# Patient Record
Sex: Female | Born: 1965 | Race: White | Hispanic: No | Marital: Single | Smoking: Current every day smoker
Health system: Southern US, Community
[De-identification: ages and names within clinical notes are randomized; demographics above are authoritative.]

## PROBLEM LIST (undated history)

## (undated) DIAGNOSIS — K219 Gastro-esophageal reflux disease without esophagitis: Secondary | ICD-10-CM

## (undated) DIAGNOSIS — R29898 Other symptoms and signs involving the musculoskeletal system: Secondary | ICD-10-CM

## (undated) DIAGNOSIS — F32A Depression, unspecified: Secondary | ICD-10-CM

## (undated) DIAGNOSIS — F329 Major depressive disorder, single episode, unspecified: Secondary | ICD-10-CM

## (undated) DIAGNOSIS — M4800 Spinal stenosis, site unspecified: Secondary | ICD-10-CM

## (undated) DIAGNOSIS — F4310 Post-traumatic stress disorder, unspecified: Secondary | ICD-10-CM

## (undated) DIAGNOSIS — F419 Anxiety disorder, unspecified: Secondary | ICD-10-CM

## (undated) DIAGNOSIS — Z889 Allergy status to unspecified drugs, medicaments and biological substances status: Secondary | ICD-10-CM

## (undated) DIAGNOSIS — F4541 Pain disorder exclusively related to psychological factors: Secondary | ICD-10-CM

## (undated) DIAGNOSIS — R7303 Prediabetes: Secondary | ICD-10-CM

## (undated) DIAGNOSIS — F40240 Claustrophobia: Secondary | ICD-10-CM

## (undated) HISTORY — PX: TUBAL LIGATION: SHX77

## (undated) HISTORY — PX: TONSILLECTOMY: SUR1361

## (undated) HISTORY — PX: DILATION AND CURETTAGE OF UTERUS: SHX78

---

## 2006-02-01 ENCOUNTER — Emergency Department: Payer: Self-pay | Admitting: Unknown Physician Specialty

## 2007-03-15 ENCOUNTER — Emergency Department: Payer: Self-pay | Admitting: Emergency Medicine

## 2007-04-17 ENCOUNTER — Emergency Department: Payer: Self-pay | Admitting: Emergency Medicine

## 2007-11-12 ENCOUNTER — Emergency Department: Payer: Self-pay | Admitting: Internal Medicine

## 2008-05-17 ENCOUNTER — Emergency Department: Payer: Self-pay

## 2008-09-26 ENCOUNTER — Emergency Department: Payer: Self-pay | Admitting: Emergency Medicine

## 2008-11-24 ENCOUNTER — Emergency Department: Payer: Self-pay | Admitting: Emergency Medicine

## 2009-02-13 ENCOUNTER — Emergency Department: Payer: Self-pay | Admitting: Emergency Medicine

## 2009-03-10 ENCOUNTER — Inpatient Hospital Stay: Payer: Self-pay | Admitting: Unknown Physician Specialty

## 2009-04-16 ENCOUNTER — Emergency Department: Payer: Self-pay | Admitting: Emergency Medicine

## 2009-08-22 ENCOUNTER — Emergency Department: Payer: Self-pay | Admitting: Emergency Medicine

## 2009-08-24 ENCOUNTER — Emergency Department: Payer: Self-pay | Admitting: Emergency Medicine

## 2010-02-12 ENCOUNTER — Emergency Department: Payer: Self-pay | Admitting: Internal Medicine

## 2010-03-30 ENCOUNTER — Emergency Department: Payer: Self-pay | Admitting: Emergency Medicine

## 2010-06-26 ENCOUNTER — Emergency Department: Payer: Self-pay | Admitting: Emergency Medicine

## 2010-09-14 ENCOUNTER — Emergency Department: Payer: Self-pay | Admitting: Emergency Medicine

## 2010-09-17 ENCOUNTER — Emergency Department: Payer: Self-pay | Admitting: Emergency Medicine

## 2010-09-27 ENCOUNTER — Emergency Department: Payer: Self-pay | Admitting: Internal Medicine

## 2011-03-07 ENCOUNTER — Emergency Department: Payer: Self-pay | Admitting: *Deleted

## 2011-11-19 ENCOUNTER — Emergency Department: Payer: Self-pay | Admitting: Emergency Medicine

## 2011-11-19 LAB — URINALYSIS, COMPLETE
Bacteria: NONE SEEN
Bilirubin,UR: NEGATIVE
Glucose,UR: NEGATIVE mg/dL (ref 0–75)
Ketone: NEGATIVE
Leukocyte Esterase: NEGATIVE
Ph: 6 (ref 4.5–8.0)
RBC,UR: 19 /HPF (ref 0–5)
Squamous Epithelial: 1

## 2011-11-19 LAB — WET PREP, GENITAL

## 2011-12-25 ENCOUNTER — Emergency Department: Payer: Self-pay | Admitting: Emergency Medicine

## 2012-03-27 ENCOUNTER — Emergency Department: Payer: Self-pay | Admitting: Emergency Medicine

## 2012-11-08 ENCOUNTER — Emergency Department: Payer: Self-pay | Admitting: Emergency Medicine

## 2013-02-25 ENCOUNTER — Emergency Department: Payer: Self-pay | Admitting: Internal Medicine

## 2013-12-13 ENCOUNTER — Emergency Department: Payer: Self-pay | Admitting: Emergency Medicine

## 2014-04-23 ENCOUNTER — Encounter: Payer: Self-pay | Admitting: Orthopedic Surgery

## 2014-05-03 ENCOUNTER — Encounter: Payer: Self-pay | Admitting: Orthopedic Surgery

## 2014-06-03 ENCOUNTER — Encounter: Payer: Self-pay | Admitting: Orthopedic Surgery

## 2015-02-04 ENCOUNTER — Other Ambulatory Visit: Payer: Self-pay | Admitting: Orthopedic Surgery

## 2015-02-06 ENCOUNTER — Encounter (HOSPITAL_BASED_OUTPATIENT_CLINIC_OR_DEPARTMENT_OTHER): Payer: Self-pay | Admitting: *Deleted

## 2015-02-13 ENCOUNTER — Ambulatory Visit (HOSPITAL_BASED_OUTPATIENT_CLINIC_OR_DEPARTMENT_OTHER): Payer: Worker's Compensation | Admitting: Anesthesiology

## 2015-02-13 ENCOUNTER — Encounter (HOSPITAL_BASED_OUTPATIENT_CLINIC_OR_DEPARTMENT_OTHER): Payer: Self-pay

## 2015-02-13 ENCOUNTER — Ambulatory Visit (HOSPITAL_BASED_OUTPATIENT_CLINIC_OR_DEPARTMENT_OTHER)
Admission: RE | Admit: 2015-02-13 | Discharge: 2015-02-13 | Disposition: A | Discharge status: Home / Self Care | Payer: Worker's Compensation | Source: Ambulatory Visit | Attending: Orthopedic Surgery | Admitting: Orthopedic Surgery

## 2015-02-13 ENCOUNTER — Encounter (HOSPITAL_BASED_OUTPATIENT_CLINIC_OR_DEPARTMENT_OTHER)
Admission: RE | Disposition: A | Discharge status: Home / Self Care | Payer: Self-pay | Source: Ambulatory Visit | Attending: Orthopedic Surgery

## 2015-02-13 DIAGNOSIS — F17210 Nicotine dependence, cigarettes, uncomplicated: Secondary | ICD-10-CM | POA: Insufficient documentation

## 2015-02-13 DIAGNOSIS — X58XXXA Exposure to other specified factors, initial encounter: Secondary | ICD-10-CM | POA: Insufficient documentation

## 2015-02-13 DIAGNOSIS — F4310 Post-traumatic stress disorder, unspecified: Secondary | ICD-10-CM | POA: Diagnosis not present

## 2015-02-13 DIAGNOSIS — Y990 Civilian activity done for income or pay: Secondary | ICD-10-CM | POA: Insufficient documentation

## 2015-02-13 DIAGNOSIS — Z79899 Other long term (current) drug therapy: Secondary | ICD-10-CM | POA: Insufficient documentation

## 2015-02-13 DIAGNOSIS — M67432 Ganglion, left wrist: Secondary | ICD-10-CM | POA: Diagnosis present

## 2015-02-13 DIAGNOSIS — F418 Other specified anxiety disorders: Secondary | ICD-10-CM | POA: Insufficient documentation

## 2015-02-13 DIAGNOSIS — S63512A Sprain of carpal joint of left wrist, initial encounter: Secondary | ICD-10-CM | POA: Diagnosis not present

## 2015-02-13 HISTORY — DX: Depression, unspecified: F32.A

## 2015-02-13 HISTORY — PX: GANGLION CYST EXCISION: SHX1691

## 2015-02-13 HISTORY — DX: Post-traumatic stress disorder, unspecified: F43.10

## 2015-02-13 HISTORY — DX: Anxiety disorder, unspecified: F41.9

## 2015-02-13 HISTORY — DX: Major depressive disorder, single episode, unspecified: F32.9

## 2015-02-13 HISTORY — PX: WRIST ARTHROSCOPY: SHX838

## 2015-02-13 SURGERY — ARTHROSCOPY, WRIST
Anesthesia: Regional | Site: Wrist | Laterality: Left

## 2015-02-13 MED ORDER — FENTANYL CITRATE (PF) 100 MCG/2ML IJ SOLN
50.0000 ug | INTRAMUSCULAR | Status: DC | PRN
  Administered 2015-02-13: 100 ug via INTRAVENOUS

## 2015-02-13 MED ORDER — OXYCODONE-ACETAMINOPHEN 5-325 MG PO TABS: ORAL_TABLET | ORAL | Status: DC

## 2015-02-13 MED ORDER — HYDROMORPHONE HCL 1 MG/ML IJ SOLN: 0.2500 mg | INTRAMUSCULAR | Status: DC | PRN

## 2015-02-13 MED ORDER — VANCOMYCIN HCL 1000 MG IV SOLR
1000.0000 mg | INTRAVENOUS | Status: DC | PRN
  Administered 2015-02-13: 1000 mg via INTRAVENOUS

## 2015-02-13 MED ORDER — DEXAMETHASONE SODIUM PHOSPHATE 10 MG/ML IJ SOLN
INTRAMUSCULAR | Status: DC | PRN
  Administered 2015-02-13: 10 mg via INTRAVENOUS

## 2015-02-13 MED ORDER — OXYCODONE HCL 5 MG PO TABS: 5.0000 mg | ORAL_TABLET | Freq: Once | ORAL | Status: DC | PRN

## 2015-02-13 MED ORDER — ONDANSETRON HCL 4 MG/2ML IJ SOLN
INTRAMUSCULAR | Status: DC | PRN
  Administered 2015-02-13: 4 mg via INTRAVENOUS

## 2015-02-13 MED ORDER — ONDANSETRON HCL 4 MG/2ML IJ SOLN: 4.0000 mg | Freq: Once | INTRAMUSCULAR | Status: DC | PRN

## 2015-02-13 MED ORDER — LIDOCAINE HCL (CARDIAC) 20 MG/ML IV SOLN
INTRAVENOUS | Status: AC
  Filled 2015-02-13: qty 5

## 2015-02-13 MED ORDER — CEFAZOLIN SODIUM-DEXTROSE 2-3 GM-% IV SOLR: 2.0000 g | INTRAVENOUS | Status: DC

## 2015-02-13 MED ORDER — ONDANSETRON HCL 4 MG/2ML IJ SOLN
INTRAMUSCULAR | Status: AC
  Filled 2015-02-13: qty 2

## 2015-02-13 MED ORDER — CHLORHEXIDINE GLUCONATE 4 % EX LIQD: 60.0000 mL | Freq: Once | CUTANEOUS | Status: DC

## 2015-02-13 MED ORDER — MIDAZOLAM HCL 2 MG/2ML IJ SOLN
1.0000 mg | INTRAMUSCULAR | Status: DC | PRN
  Administered 2015-02-13: 2 mg via INTRAVENOUS

## 2015-02-13 MED ORDER — FENTANYL CITRATE (PF) 100 MCG/2ML IJ SOLN
INTRAMUSCULAR | Status: AC
  Filled 2015-02-13: qty 2

## 2015-02-13 MED ORDER — OXYCODONE HCL 5 MG/5ML PO SOLN: 5.0000 mg | Freq: Once | ORAL | Status: DC | PRN

## 2015-02-13 MED ORDER — PROPOFOL 10 MG/ML IV BOLUS
INTRAVENOUS | Status: DC | PRN
  Administered 2015-02-13: 150 mg via INTRAVENOUS

## 2015-02-13 MED ORDER — GLYCOPYRROLATE 0.2 MG/ML IJ SOLN: 0.2000 mg | Freq: Once | INTRAMUSCULAR | Status: DC | PRN

## 2015-02-13 MED ORDER — VANCOMYCIN HCL IN DEXTROSE 1-5 GM/200ML-% IV SOLN
INTRAVENOUS | Status: AC
  Filled 2015-02-13: qty 200

## 2015-02-13 MED ORDER — MIDAZOLAM HCL 2 MG/2ML IJ SOLN
INTRAMUSCULAR | Status: AC
  Filled 2015-02-13: qty 2

## 2015-02-13 MED ORDER — LACTATED RINGERS IV SOLN
INTRAVENOUS | Status: DC
  Administered 2015-02-13 (×2): via INTRAVENOUS

## 2015-02-13 MED ORDER — SODIUM CHLORIDE 0.9 % IR SOLN
Status: DC | PRN
  Administered 2015-02-13: 1500 mL

## 2015-02-13 MED ORDER — CEFAZOLIN SODIUM-DEXTROSE 2-3 GM-% IV SOLR
INTRAVENOUS | Status: AC
Start: 2015-02-13 — End: 2015-02-13
  Filled 2015-02-13: qty 50

## 2015-02-13 MED ORDER — LIDOCAINE HCL (CARDIAC) 20 MG/ML IV SOLN
INTRAVENOUS | Status: DC | PRN
  Administered 2015-02-13: 60 mg via INTRAVENOUS

## 2015-02-13 MED ORDER — SCOPOLAMINE 1 MG/3DAYS TD PT72: 1.0000 | MEDICATED_PATCH | Freq: Once | TRANSDERMAL | Status: DC | PRN

## 2015-02-13 MED ORDER — DEXAMETHASONE SODIUM PHOSPHATE 10 MG/ML IJ SOLN
INTRAMUSCULAR | Status: AC
  Filled 2015-02-13: qty 1

## 2015-02-13 SURGICAL SUPPLY — 81 items
BANDAGE ELASTIC 3 VELCRO ST LF (GAUZE/BANDAGES/DRESSINGS) ×3 IMPLANT
BANDAGE ELASTIC 4 VELCRO ST LF (GAUZE/BANDAGES/DRESSINGS) IMPLANT
BLADE CUDA 2.0 (BLADE) IMPLANT
BLADE EAR TYMPAN 2.5 60D BEAV (BLADE) IMPLANT
BLADE MINI RND TIP GREEN BEAV (BLADE) IMPLANT
BLADE SURG 15 STRL LF DISP TIS (BLADE) ×2 IMPLANT
BLADE SURG 15 STRL SS (BLADE) ×4
BNDG ESMARK 4X9 LF (GAUZE/BANDAGES/DRESSINGS) ×3 IMPLANT
BNDG GAUZE ELAST 4 BULKY (GAUZE/BANDAGES/DRESSINGS) IMPLANT
BUR CUDA 2.9 (BURR) IMPLANT
BUR CUDA 2.9MM (BURR)
BUR FULL RADIUS 2.0 (BURR) IMPLANT
BUR FULL RADIUS 2.0MM (BURR)
BUR FULL RADIUS 2.9 (BURR) IMPLANT
BUR FULL RADIUS 2.9MM (BURR)
BUR GATOR 2.9 (BURR) IMPLANT
BUR GATOR 2.9MM (BURR)
BUR SPHERICAL 2.9 (BURR) IMPLANT
BUR SPHERICAL 2.9MM (BURR)
CANISTER SUCT 1200ML W/VALVE (MISCELLANEOUS) ×6 IMPLANT
CHLORAPREP W/TINT 26ML (MISCELLANEOUS) ×3 IMPLANT
CLOSURE WOUND 1/2 X4 (GAUZE/BANDAGES/DRESSINGS)
CORDS BIPOLAR (ELECTRODE) ×3 IMPLANT
COVER BACK TABLE 60X90IN (DRAPES) ×3 IMPLANT
CUFF TOURNIQUET SINGLE 18IN (TOURNIQUET CUFF) ×3 IMPLANT
DRAPE EXTREMITY T 121X128X90 (DRAPE) ×3 IMPLANT
DRAPE OEC MINIVIEW 54X84 (DRAPES) IMPLANT
DRAPE SURG 17X23 STRL (DRAPES) ×3 IMPLANT
DRAPE U 20/CS (DRAPES) ×3 IMPLANT
ELECT SMALL JOINT 90D BASC (ELECTRODE) IMPLANT
GAUZE SPONGE 4X4 12PLY STRL (GAUZE/BANDAGES/DRESSINGS) ×3 IMPLANT
GAUZE XEROFORM 1X8 LF (GAUZE/BANDAGES/DRESSINGS) ×3 IMPLANT
GLOVE BIO SURGEON STRL SZ7.5 (GLOVE) ×3 IMPLANT
GLOVE BIOGEL PI IND STRL 7.0 (GLOVE) ×2 IMPLANT
GLOVE BIOGEL PI IND STRL 8.5 (GLOVE) IMPLANT
GLOVE BIOGEL PI INDICATOR 7.0 (GLOVE) ×4
GLOVE BIOGEL PI INDICATOR 8.5 (GLOVE)
GLOVE ECLIPSE 6.5 STRL STRAW (GLOVE) ×6 IMPLANT
GLOVE SURG ORTHO 8.0 STRL STRW (GLOVE) IMPLANT
GOWN STRL REUS W/ TWL LRG LVL3 (GOWN DISPOSABLE) ×2 IMPLANT
GOWN STRL REUS W/TWL LRG LVL3 (GOWN DISPOSABLE) ×4
GOWN STRL REUS W/TWL XL LVL3 (GOWN DISPOSABLE) ×6 IMPLANT
IV SET EXTENSION GRAVITY 40 LF (IV SETS) ×3 IMPLANT
MANIFOLD NEPTUNE II (INSTRUMENTS) IMPLANT
NDL SAFETY ECLIPSE 18X1.5 (NEEDLE) ×2 IMPLANT
NEEDLE HYPO 18GX1.5 SHARP (NEEDLE) ×4
NEEDLE HYPO 22GX1.5 SAFETY (NEEDLE) ×3 IMPLANT
NEEDLE SPNL 18GX3.5 QUINCKE PK (NEEDLE) IMPLANT
NEEDLE TUOHY 20GX3.5 (NEEDLE) IMPLANT
PACK BASIN DAY SURGERY FS (CUSTOM PROCEDURE TRAY) ×3 IMPLANT
PAD CAST 3X4 CTTN HI CHSV (CAST SUPPLIES) ×1 IMPLANT
PADDING CAST ABS 3INX4YD NS (CAST SUPPLIES) ×2
PADDING CAST ABS 4INX4YD NS (CAST SUPPLIES) ×2
PADDING CAST ABS COTTON 3X4 (CAST SUPPLIES) ×1 IMPLANT
PADDING CAST ABS COTTON 4X4 ST (CAST SUPPLIES) ×1 IMPLANT
PADDING CAST COTTON 3X4 STRL (CAST SUPPLIES) ×2
ROUTER HOODED VORTEX 2.9MM (BLADE) IMPLANT
SET ARTHROSCOPY TUBING (MISCELLANEOUS) ×2
SET ARTHROSCOPY TUBING LN (MISCELLANEOUS) ×1 IMPLANT
SET SM JOINT TUBING/CANN (CANNULA) IMPLANT
SLEEVE SCD COMPRESS KNEE MED (MISCELLANEOUS) ×3 IMPLANT
SPLINT PLASTER CAST XFAST 3X15 (CAST SUPPLIES) ×20 IMPLANT
SPLINT PLASTER XTRA FASTSET 3X (CAST SUPPLIES) ×40
STOCKINETTE 4X48 STRL (DRAPES) ×3 IMPLANT
STRIP CLOSURE SKIN 1/2X4 (GAUZE/BANDAGES/DRESSINGS) IMPLANT
SUCTION FRAZIER TIP 10 FR DISP (SUCTIONS) IMPLANT
SUT ETHILON 4 0 PS 2 18 (SUTURE) ×3 IMPLANT
SUT ETHILON 5 0 P 3 18 (SUTURE)
SUT MERSILENE 4 0 P 3 (SUTURE) IMPLANT
SUT NYLON ETHILON 5-0 P-3 1X18 (SUTURE) IMPLANT
SUT PDS AB 2-0 CT2 27 (SUTURE) IMPLANT
SUT STEEL 4 0 (SUTURE) IMPLANT
SUT VIC AB 2-0 PS2 27 (SUTURE) IMPLANT
SUT VICRYL 4-0 PS2 18IN ABS (SUTURE) IMPLANT
SYR BULB 3OZ (MISCELLANEOUS) ×3 IMPLANT
SYR CONTROL 10ML LL (SYRINGE) ×3 IMPLANT
TUBE CONNECTING 20'X1/4 (TUBING) ×1
TUBE CONNECTING 20X1/4 (TUBING) ×2 IMPLANT
UNDERPAD 30X30 (UNDERPADS AND DIAPERS) ×3 IMPLANT
WAND 1.5 MICROBLATOR (SURGICAL WAND) ×3 IMPLANT
WATER STERILE IRR 1000ML POUR (IV SOLUTION) ×3 IMPLANT

## 2015-02-13 NOTE — H&P (Signed)
  Sandra BachelorLinda K Cantrell is an 49 y.o. female.   Chief Complaint: left wrist pain and ganglion HPI: 49 yo rhd female states she injured left wrist at work 02/07/15 when a box broke causing a rotational injury to left wrist.  Has had continued pain.  MRI with possible SL and LT injury.  Also volar ganglion cyst noted.  She wishes to have left wrist arthroscopy and ganglion excision.    Past Medical History  Diagnosis Date  . Post traumatic stress disorder (PTSD)   . Anxiety   . Depression     Past Surgical History  Procedure Laterality Date  . Tonsillectomy    . Dilation and curettage of uterus    . Tubal ligation      History reviewed. No pertinent family history. Social History:  reports that she has been smoking Cigarettes.  She has been smoking about 0.50 packs per day. She has never used smokeless tobacco. She reports that she does not drink alcohol or use illicit drugs.  Allergies:  Allergies  Allergen Reactions  . Penicillins Hives    Medications Prior to Admission  Medication Sig Dispense Refill  . buPROPion (WELLBUTRIN XL) 150 MG 24 hr tablet Take 150 mg by mouth daily.      No results found for this or any previous visit (from the past 48 hour(s)).  No results found.   A comprehensive review of systems was negative except for: Eyes: positive for contacts/glasses Ears, nose, mouth, throat, and face: positive for tinnitus Neurological: positive for headaches Behavioral/Psych: positive for anxiety, depression, sleep disturbance and PTSD  Blood pressure 120/52, pulse 78, temperature 98.3 F (36.8 C), temperature source Oral, resp. rate 20, height 5\' 6"  (1.676 m), weight 87.091 kg (192 lb), last menstrual period 02/05/2015, SpO2 99 %.  General appearance: alert, cooperative and appears stated age Head: Normocephalic, without obvious abnormality, atraumatic Neck: supple, symmetrical, trachea midline Resp: clear to auscultation bilaterally Cardio: regular rate and  rhythm GI: non tender Extremities: intact sensation and capillary refill all digits.  +epl/fpl/io.  no wounds.   Pulses: 2+ and symmetric Skin: Skin color, texture, turgor normal. No rashes or lesions Neurologic: Grossly normal Incision/Wound: none  Assessment/Plan Left wrist SL/LT injury and volar ganglion cyst.  Plan left wrist arthroscopy with possible debridement vs shrinkage and excision of volar ganglion cyst.  Non operative and operative treatment options were discussed with the patient and patient wishes to proceed with operative treatment. Risks, benefits, and alternatives of surgery were discussed and the patient agrees with the plan of care.   Jameica Couts R 02/13/2015, 12:58 PM

## 2015-02-13 NOTE — Transfer of Care (Signed)
Immediate Anesthesia Transfer of Care Note  Patient: Sandra Cantrell  Procedure(s) Performed: Procedure(s): LEFT ARTHROSCOPY WRIST VOLAR GANGLION EXCISION (Left) REMOVAL GANGLION OF WRIST (Left)  Patient Location: PACU  Anesthesia Type:GA combined with regional for post-op pain  Level of Consciousness: awake and patient cooperative  Airway & Oxygen Therapy: Patient Spontanous Breathing and Patient connected to face mask oxygen  Post-op Assessment: Report given to RN, Post -op Vital signs reviewed and stable and Patient moving all extremities  Post vital signs: Reviewed and stable  Last Vitals:  Filed Vitals:   02/13/15 1138  BP: 120/52  Pulse: 78  Temp: 36.8 C  Resp: 20    Complications: No apparent anesthesia complications

## 2015-02-13 NOTE — Progress Notes (Signed)
Assisted Dr. Joslin with left, ultrasound guided, interscalene  block. Side rails up, monitors on throughout procedure. See vital signs in flow sheet. Tolerated Procedure well. 

## 2015-02-13 NOTE — Anesthesia Procedure Notes (Addendum)
Anesthesia Regional Block:  Supraclavicular block  Pre-Anesthetic Checklist: ,, timeout performed, Correct Patient, Correct Site, Correct Laterality, Correct Procedure, Correct Position, site marked, Risks and benefits discussed,  Surgical consent,  Pre-op evaluation,  At surgeon's request and post-op pain management  Laterality: Left  Prep: chloraprep       Needles:  Injection technique: Single-shot  Needle Type: Echogenic Stimulator Needle     Needle Length: 9cm 9 cm Needle Gauge: 21 and 21 G    Additional Needles:  Procedures: ultrasound guided (picture in chart) Supraclavicular block Narrative:  Start time: 02/13/2015 1:10 PM End time: 02/13/2015 1:15 PM Injection made incrementally with aspirations every 5 mL.  Performed by: Personally   Additional Notes: 30 cc 0.5% Bupivacaine with 1:200 Epi injected easily   Procedure Name: LMA Insertion Date/Time: 02/13/2015 1:52 PM Performed by: Curly ShoresRAFT, Melady Chow W Pre-anesthesia Checklist: Patient identified, Emergency Drugs available, Suction available and Patient being monitored Patient Re-evaluated:Patient Re-evaluated prior to inductionOxygen Delivery Method: Circle System Utilized Preoxygenation: Pre-oxygenation with 100% oxygen Intubation Type: IV induction Ventilation: Mask ventilation without difficulty LMA: LMA inserted LMA Size: 4.0 Number of attempts: 1 Airway Equipment and Method: Bite block (patient states jaw sometimes locks, small bite block used.) Placement Confirmation: positive ETCO2 and breath sounds checked- equal and bilateral Tube secured with: Tape Dental Injury: Teeth and Oropharynx as per pre-operative assessment

## 2015-02-13 NOTE — Anesthesia Preprocedure Evaluation (Addendum)
Anesthesia Evaluation  Patient identified by MRN, date of birth, ID band Patient awake    Reviewed: Allergy & Precautions, NPO status , Patient's Chart, lab work & pertinent test results  Airway Mallampati: II  TM Distance: >3 FB Neck ROM: Full    Dental  (+) Teeth Intact, Dental Advisory Given   Pulmonary Current Smoker,    breath sounds clear to auscultation       Cardiovascular negative cardio ROS   Rhythm:Regular Rate:Normal     Neuro/Psych PSYCHIATRIC DISORDERS Anxiety Depression    GI/Hepatic negative GI ROS, Neg liver ROS,   Endo/Other  negative endocrine ROS  Renal/GU negative Renal ROS     Musculoskeletal negative musculoskeletal ROS (+)   Abdominal   Peds negative pediatric ROS (+)  Hematology negative hematology ROS (+)   Anesthesia Other Findings   Reproductive/Obstetrics negative OB ROS                            Anesthesia Physical Anesthesia Plan  ASA: II  Anesthesia Plan: General   Post-op Pain Management: GA combined w/ Regional for post-op pain   Induction: Intravenous  Airway Management Planned: LMA  Additional Equipment:   Intra-op Plan:   Post-operative Plan:   Informed Consent: I have reviewed the patients History and Physical, chart, labs and discussed the procedure including the risks, benefits and alternatives for the proposed anesthesia with the patient or authorized representative who has indicated his/her understanding and acceptance.   Dental advisory given  Plan Discussed with: CRNA and Anesthesiologist  Anesthesia Plan Comments: (scapholunate tear L . Wrist  Smoker Anxiety  Plan GA with LMA and  Supraclavicular block  Kipp Broodavid Joslin         )        Anesthesia Quick Evaluation

## 2015-02-13 NOTE — Anesthesia Postprocedure Evaluation (Signed)
  Anesthesia Post-op Note  Patient: Sandra BachelorLinda K Darnell  Procedure(s) Performed: Procedure(s): LEFT ARTHROSCOPY WRIST VOLAR GANGLION EXCISION (Left) REMOVAL GANGLION OF WRIST (Left)  Patient Location: PACU  Anesthesia Type:GA combined with regional for post-op pain  Level of Consciousness: awake, alert  and oriented  Airway and Oxygen Therapy: Patient Spontanous Breathing  Post-op Pain: none  Post-op Assessment: Post-op Vital signs reviewed and Patient's Cardiovascular Status Stable              Post-op Vital Signs: Reviewed and stable  Last Vitals:  Filed Vitals:   02/13/15 1600  BP: 116/72  Pulse: 74  Temp:   Resp: 16    Complications: No apparent anesthesia complications

## 2015-02-13 NOTE — Op Note (Signed)
[id number] 

## 2015-02-13 NOTE — Brief Op Note (Signed)
02/13/2015  3:32 PM  PATIENT:  Keenan BachelorLinda K Woznick  49 y.o. female  PRE-OPERATIVE DIAGNOSIS:  LEFT SCAPHLUNATE TEAR VOLAR GANGLION CYST  POST-OPERATIVE DIAGNOSIS:  LEFT SCAPHLUNATE TEAR VOLAR GANGLION CYST  PROCEDURE:  Procedure(s): LEFT ARTHROSCOPY WRIST VOLAR GANGLION EXCISION (Left) REMOVAL GANGLION OF WRIST (Left)  SURGEON:  Surgeon(s) and Role:    * Betha LoaKevin Bari Handshoe, MD - Primary    * Cindee SaltGary Harlan Ervine, MD - Assisting  PHYSICIAN ASSISTANT:   ASSISTANTS: Cindee SaltGary Mazie Fencl, MD   ANESTHESIA:   regional and general  EBL:  Total I/O In: 1200 [I.V.:1200] Out: -   BLOOD ADMINISTERED:none  DRAINS: none   LOCAL MEDICATIONS USED:  NONE  SPECIMEN:  Source of Specimen:  left wrist  DISPOSITION OF SPECIMEN:  PATHOLOGY  COUNTS:  YES  TOURNIQUET:   Total Tourniquet Time Documented: Upper Arm (Left) - 26 minutes Total: Upper Arm (Left) - 26 minutes   DICTATION: .Other Dictation: Dictation Number [id number]  PLAN OF CARE: Discharge to home after PACU  PATIENT DISPOSITION:  PACU - hemodynamically stable.

## 2015-02-13 NOTE — Discharge Instructions (Addendum)

## 2015-02-14 ENCOUNTER — Encounter (HOSPITAL_BASED_OUTPATIENT_CLINIC_OR_DEPARTMENT_OTHER): Payer: Self-pay | Admitting: Orthopedic Surgery

## 2015-02-14 NOTE — Op Note (Signed)
NAMEJANELL, KEELING NO.:  [id number]  MEDICAL RECORD NO.:  [id number]  LOCATION:                                 FACILITY:  PHYSICIAN:  Betha Loa, MD             DATE OF BIRTH:  DATE OF PROCEDURE:  02/13/2015 DATE OF DISCHARGE:                              OPERATIVE REPORT   PREOPERATIVE DIAGNOSIS:  Left wrist volar ganglion cyst and scapholunate ligament, possible lunotriquetral ligament injuries.  POSTOPERATIVE DIAGNOSIS:  Left wrist volar ganglion cyst and scapholunate ligament tear.  PROCEDURE:   1. Left wrist arthroscopy with scapholunate thermal shrinkage 2. Left wrist volar ganglion excision via separate incision.  SURGEON:  Betha Loa, MD  ASSISTANT:  Cindee Salt, M.D.  ANESTHESIA:  General with regional.  IV FLUIDS:  Per anesthesia flow sheet.  ESTIMATED BLOOD LOSS:  Minimal.  COMPLICATIONS:  None.  SPECIMENS:  Left wrist ganglion to Pathology.  TOURNIQUET TIME:  26 minutes.  DISPOSITION:  Stable to PACU.  INDICATIONS:  Ms. Washburn is a 49 year old female who states approximately 1 year ago she injured her left wrist when a box she was carrying broke and twisted her wrist.  She underwent other medical care, all nonoperative, and was referred to me approximately 1 month ago.  MRI showed volar ganglion cyst and possible SL and LT ligament injuries.  We discussed treatment options including wrist arthroscopy with ganglion excision and possible capsular shrinkage versus debridement.  Risks, benefits, and alternatives of surgery were discussed including risk of blood loss; infection; damage to nerves, vessels, tendons, ligaments, bone; failure of surgery; need for additional surgery; complications with wound healing, continued pain, recurrence of ganglion.  She voiced understanding of these risks and elected to proceed.  OPERATIVE COURSE:  After being identified preoperatively by myself, the patient and I agreed upon procedure and  site of procedure.  Surgical site was marked.  The risks, benefits, and alternatives of surgery were reviewed and she wished to proceed.  Surgical consent had been signed. She was given IV vancomycin as preoperative antibiotic prophylaxis due to penicillin allergy.  A regional block was performed by Anesthesia in the preop holding room.  She was transported to the operating room and placed on the operating room table in a supine position with the left upper extremity on arm board.  General anesthesia was induced by anesthesiologist.  The left upper extremity was prepped and draped in normal sterile orthopedic fashion.  Surgical pause was performed between surgeons, anesthesia, and operating room staff and all were in agreement as to the patient, procedure, and site of procedure.  The left arm was secured in the arthroscopy tower.  Incision was made at the 3/4 portal after insufflation of the joint.  This was carried into subcutaneous tissues by spreading.  The capsule was entered with a trocar.  Camera was introduced.  The wrist joint was visualized.  The scapholunate ligament had tear.  I was not able to place the camera or later the trocar in between the scaphoid and lunate, but the probe would pass in between.  The ligament was torn dorsally and stretched and patulous proximally and centrally.  The LT ligament was visualized and was intact.  The TFCC was rough and abraded, but it was intact.  There was no peripheral tear noted.  The prestyloid recess was noted.  A 4/5 portal was made for introduction of arthroscopic instruments including the probe and shaver.  The 3/4 and 4/5 portals were used sequentially for debridement of the joint.  There was significant fraying of cartilage and capsule as well as some chondral surface, which was debrided with a shaver.  The ArthroWand was introduced and the patulous portion of the scapholunate ligament shrunk.  The camera was then introduced  through a separate portal into the midcarpal joint, which was examined.  The scapholunate was not widened, but with placement of the K- wire over the lunate, there would be no step-off.  The LT interval was not widened.  The capitohamate articulation was visible.  There was some tearing of the intercarpal ligament tear.  The STT joint was visualized and appeared intact.  The arthroscopic equipments were removed and the arm taken down from the arthroscopy tower.  An Esmarch bandage was used to exsanguinate the limb and the tourniquet at the proximal aspect of the extremity was inflated to 250 mmHg.  An incision was made at the volar radial aspect of the wrist and carried into the subcutaneous tissues by spreading technique.  Bipolar electrocautery was used to obtain hemostasis.  The FCR tendon sheath was incised both superficially and deep.  The FCR and FPL were swept ulnarly to reach the ganglion. The ganglion was identified and removed.  It was sent to Pathology for examination.  The stalk toward the radiocarpal joint was identified.  It was lightly debrided with rongeurs and treated with bipolar.  4-0 Vicryl suture was placed in the rent in the capsule.  The wound was copiously irrigated with sterile saline.  Two inverted interrupted Vicryl sutures were placed in the subcutaneous tissues and skin was closed with 4-0 nylon in a horizontal mattress fashion.  The dorsal wounds were closed with the nylon suture as well.  The wounds were all dressed with sterile Xeroform, 4x4s, and wrapped with a Kerlix bandage.  Thumb spica splint was placed and wrapped with Kerlix and Ace bandage.  Tourniquet was deflated at 26 minutes.  Fingertips were pink with brisk capillary refill after deflation of tourniquet.  The operative drapes were broken down and the patient was awoken from anesthesia safely.  She was transferred back to the stretcher and taken to PACU in stable condition. I will see her back in  the office in 1 week for postoperative followup. I will give her Percocet 5/325 one to two p.o. q.6 hours p.r.n. pain, dispensed #30.     Betha LoaKevin Tyauna Lacaze, MD     KK/MEDQ  D:  02/13/2015  T:  02/14/2015  Job:  [id number]

## 2015-03-18 ENCOUNTER — Emergency Department
Admission: EM | Admit: 2015-03-18 | Discharge: 2015-03-18 | Disposition: A | Discharge status: Home / Self Care | Payer: Self-pay | Attending: Emergency Medicine | Admitting: Emergency Medicine

## 2015-03-18 DIAGNOSIS — Z880 Allergy status to penicillin: Secondary | ICD-10-CM | POA: Insufficient documentation

## 2015-03-18 DIAGNOSIS — Z79891 Long term (current) use of opiate analgesic: Secondary | ICD-10-CM | POA: Insufficient documentation

## 2015-03-18 DIAGNOSIS — F17210 Nicotine dependence, cigarettes, uncomplicated: Secondary | ICD-10-CM | POA: Insufficient documentation

## 2015-03-18 DIAGNOSIS — Z79899 Other long term (current) drug therapy: Secondary | ICD-10-CM | POA: Insufficient documentation

## 2015-03-18 DIAGNOSIS — N764 Abscess of vulva: Secondary | ICD-10-CM | POA: Insufficient documentation

## 2015-03-18 DIAGNOSIS — N760 Acute vaginitis: Secondary | ICD-10-CM

## 2015-03-18 MED ORDER — SULFAMETHOXAZOLE-TRIMETHOPRIM 800-160 MG PO TABS: 1.0000 | ORAL_TABLET | Freq: Two times a day (BID) | ORAL | Status: DC

## 2015-03-18 MED ORDER — HYDROCODONE-ACETAMINOPHEN 5-325 MG PO TABS: 1.0000 | ORAL_TABLET | ORAL | Status: DC | PRN

## 2015-03-18 NOTE — ED Notes (Signed)
Pt c/o left labia swelling and pain.. Denies any drainage

## 2015-03-18 NOTE — ED Notes (Signed)
States she noticed a swollen area to side of left labia 2 days ago ..possible abscess

## 2015-03-18 NOTE — ED Provider Notes (Signed)
[facility] Emergency Department Provider Note  ____________________________________________  Time seen: Approximately 3:01 PM  I have reviewed the triage vital signs and the nursing notes.   HISTORY  Chief Complaint Abscess    HPI Sandra Cantrell is a 49 y.o. female who presents to the emergency department complaining of left-sided labial swelling and pain. She states that this is secondary of symptoms. She states area initially was a "burning sensation". She states that over the intervening 48 hours she has noticed experiencing and now there is a "knot" on the left labia. She denies any fevers or chills, abdominal pain, nausea or vomiting, dysuria or polyuria. Patient is tried over-the-counter Tylenol with no relief. Patient states the pain is sharp, constant, and worse with palpation or sitting.   Past Medical History  Diagnosis Date  . Post traumatic stress disorder (PTSD)   . Anxiety   . Depression     There are no active problems to display for this patient.   Past Surgical History  Procedure Laterality Date  . Tonsillectomy    . Dilation and curettage of uterus    . Tubal ligation    . Wrist arthroscopy Left 02/13/2015    Procedure: LEFT ARTHROSCOPY WRIST VOLAR GANGLION EXCISION;  Surgeon: Betha Loa, MD;  Location: [facility];  Service: Orthopedics;  Laterality: Left;  . Ganglion cyst excision Left 02/13/2015    Procedure: REMOVAL GANGLION OF WRIST;  Surgeon: Betha Loa, MD;  Location: [facility];  Service: Orthopedics;  Laterality: Left;    Current Outpatient Rx  Name  Route  Sig  Dispense  Refill  . buPROPion (WELLBUTRIN XL) 150 MG 24 hr tablet   Oral   Take 150 mg by mouth daily.         Marland Kitchen HYDROcodone-acetaminophen (NORCO/VICODIN) 5-325 MG tablet   Oral   Take 1 tablet by mouth every 4 (four) hours as needed for moderate pain.   15 tablet   0   . oxyCODONE-acetaminophen (PERCOCET) 5-325 MG  tablet      1-2 tabs po q6 hours prn pain   30 tablet   0   . sulfamethoxazole-trimethoprim (BACTRIM DS,SEPTRA DS) 800-160 MG tablet   Oral   Take 1 tablet by mouth 2 (two) times daily.   14 tablet   0     Allergies Penicillins  No family history on file.  Social History Social History  Substance Use Topics  . Smoking status: Current Every Day Smoker -- 0.50 packs/day    Types: Cigarettes  . Smokeless tobacco: Never Used  . Alcohol Use: No    Review of Systems Constitutional: No fever/chills Eyes: No visual changes. ENT: No sore throat. Cardiovascular: Denies chest pain. Respiratory: Denies shortness of breath. Gastrointestinal: No abdominal pain.  No nausea, no vomiting.  No diarrhea.  No constipation. Genitourinary: Negative for dysuria. Musculoskeletal: Negative for back pain. Skin: Negative for rash. Endorses swelling/knot left labia. Neurological: Negative for headaches, focal weakness or numbness.  10-point ROS otherwise negative.  ____________________________________________   PHYSICAL EXAM:  VITAL SIGNS: ED Triage Vitals  Enc Vitals Group     BP 03/18/15 1450 129/61 mmHg     Pulse Rate 03/18/15 1450 89     Resp 03/18/15 1450 16     Temp 03/18/15 1450 98.9 F (37.2 C)     Temp Source 03/18/15 1450 Oral     SpO2 03/18/15 1450 99 %     Weight 03/18/15 1450 190 lb (86.183  kg)     Height 03/18/15 1450 5\' 5"  (1.651 m)     Head Cir --      Peak Flow --      Pain Score 03/18/15 1451 7     Pain Loc --      Pain Edu? --      Excl. in GC? --     Constitutional: Alert and oriented. Well appearing and in no acute distress. Eyes: Conjunctivae are normal. PERRL. EOMI. Head: Atraumatic. Nose: No congestion/rhinnorhea. Mouth/Throat: Mucous membranes are moist.  Oropharynx non-erythematous. Neck: No stridor.   Hematological/Lymphatic/Immunilogical: No cervical lymphadenopathy. Cardiovascular: Normal rate, regular rhythm. Grossly normal heart sounds.   Good peripheral circulation. Respiratory: Normal respiratory effort.  No retractions. Lungs CTAB. Gastrointestinal: Soft and nontender. No distention. No abdominal bruits. No CVA tenderness. Genitourinary: External genitalia exam reveals erythematous, edematous lesion to left labia. Area is firm to palpation. No fluctuance noted. No drainage noted. No other abnormality noted to the external genitalia. Pelvic exam was not performed. Musculoskeletal: No lower extremity tenderness nor edema.  No joint effusions. Neurologic:  Normal speech and language. No gross focal neurologic deficits are appreciated. No gait instability. Skin:  Skin is warm, dry and intact. No rash noted. Psychiatric: Mood and affect are normal. Speech and behavior are normal.  ____________________________________________   LABS (all labs ordered are listed, but only abnormal results are displayed)  Labs Reviewed - No data to display ____________________________________________  EKG   ____________________________________________  RADIOLOGY   ____________________________________________   PROCEDURES  Procedure(s) performed: None  Critical Care performed: No  ____________________________________________   INITIAL IMPRESSION / ASSESSMENT AND PLAN / ED COURSE  Pertinent labs & imaging results that were available during my care of the patient were reviewed by me and considered in my medical decision making (see chart for details).  The patient's history, symptoms, physical exam are taken and consideration for diagnosis. Area on left labia was examined and revealed erythematous, edematous, firm to the touch lesion. This is consistent with bacterial skin infection. There is no indication for I&D at this time. Strict instructions to the patient given to monitor area for any increasing fluctuance or symptoms. Patient verbalizes understanding. The patient released sent home with narcotic pain medication as well as  antibiotics for symptom control. Patient verbalizes understanding of the diagnosis and treatment plan and verbalizes compliance with same. ____________________________________________   FINAL CLINICAL IMPRESSION(S) / ED DIAGNOSES  Final diagnoses:  Labial infection      Racheal PatchesJonathan D Shakena Callari, PA-C 03/18/15 1519  Phineas SemenGraydon Goodman, MD 03/18/15 [id number]

## 2015-03-18 NOTE — Discharge Instructions (Signed)
Cellulitis    Cellulitis is an infection of the skin and the tissue beneath it. The infected area is usually red and tender. Cellulitis occurs most often in the arms and lower legs.  CAUSES  Cellulitis is caused by bacteria that enter the skin through cracks or cuts in the skin. The most common types of bacteria that cause cellulitis are staphylococci and streptococci.  SIGNS AND SYMPTOMS  Redness and warmth.  Swelling.  Tenderness or pain.  Fever. DIAGNOSIS  Your health care provider can usually determine what is wrong based on a physical exam. Blood tests may also be done.  TREATMENT  Treatment usually involves taking an antibiotic medicine.  HOME CARE INSTRUCTIONS  Take your antibiotic medicine as directed by your health care provider. Finish the antibiotic even if you start to feel better.  Keep the infected arm or leg elevated to reduce swelling.  Apply a warm cloth to the affected area up to 4 times per day to relieve pain.  Take medicines only as directed by your health care provider.  Keep all follow-up visits as directed by your health care provider. SEEK MEDICAL CARE IF:  You notice red streaks coming from the infected area.  Your red area gets larger or turns dark in color.  Your bone or joint underneath the infected area becomes painful after the skin has healed.  Your infection returns in the same area or another area.  You notice a swollen bump in the infected area.  You develop new symptoms.  You have a fever. SEEK IMMEDIATE MEDICAL CARE IF:  You feel very sleepy.  You develop vomiting or diarrhea.  You have a general ill feeling (malaise) with muscle aches and pains. This information is not intended to replace advice given to you by your health care provider. Make sure you discuss any questions you have with your health care provider.  Document Released: 01/27/2005 Document Revised: 01/08/2015 Document Reviewed: 07/05/2011  Elsevier Interactive Patient Education  2016 [employer] Inc[employer]   Antibiotic Medicine    Antibiotic medicines are used to treat infections caused by bacteria. They work by hurting or killing the germs that are making you sick.  HOW WILL MY MEDICINE BE PICKED?  There are many kinds of antibiotic medicines. To help your doctor pick one, tell your doctor if:  You have any allergies.  You are pregnant or plan to get pregnant.  You are breastfeeding.  You are taking any medicines. These include over-the-counter medicines, prescription medicines, and herbal remedies.  You have a medical condition or problem. If you have questions about why your medicine was picked, ask.  FOR HOW LONG SHOULD I TAKE MY MEDICINE?  Take your medicine for as long as your doctor tells you to. Do not stop taking it when you feel better. If you stop taking it too soon:  You may start to feel sick again.  Your infection may get harder to treat.  New problems may develop. WHAT IF I MISS A DOSE?  Try not to miss any doses of antibiotic medicine. If you miss a dose:  Take the dose as soon as you can.  If you are taking 2 doses a day, take the next dose in 5 to 6 hours.  If you are taking 3 or more doses a day, take the next dose in 2 to 4 hours. Then go back to the normal schedule. If you cannot take a missed dose, take the next dose on time. Then take the missed  dose after you have taken all the doses as told by your doctor, as if you had one more dose left.  DOES THIS MEDICINE AFFECT BIRTH CONTROL?  Birth control pills may not work while you are on antibiotic medicines. If you are taking birth control pills, keep taking them as usual. Use a second form of birth control, such as a condom. Keep using the second form of birth control until you are finished with your current 1 month cycle of birth control pills.  GET HELP IF:  You get worse.  You do not feel better a few days after starting the medicine.  You throw up (vomit).  There are white patches in your  mouth.  You have new joint pain after starting the medicine.  You have new muscle aches after starting the medicine.  You had a fever before starting the medicine, and it comes back.  You have any symptoms of an allergic reaction, such as an itchy rash. If this happens, stop taking the medicine. GET HELP RIGHT AWAY IF:  Your pee (urine) turns dark or becomes blood-colored.  Your skin turns yellow.  You bruise or bleed easily.  You have very bad watery poop (diarrhea) and cramps in your belly (abdomen).  You have a very bad headache.  You have signs of a very bad allergic reaction, such as:  Trouble breathing.  Wheezing.  Swelling of the lips, tongue, or face.  Fainting.  Blisters on the skin or in the mouth. If you have signs of a very bad allergic reaction, stop taking the antibiotic medicine right away.  This information is not intended to replace advice given to you by your health care provider. Make sure you discuss any questions you have with your health care provider.  Document Released: 01/27/2008 Document Revised: 01/08/2015 Document Reviewed: 09/04/2014  Elsevier Interactive Patient Education [employer]

## 2015-03-18 NOTE — ED Notes (Signed)
Assisted Gala RomneyJonathan Cuthriell, PA-C with examination of patient.  Patient tolerated well.

## 2015-04-01 DIAGNOSIS — S6990XA Unspecified injury of unspecified wrist, hand and finger(s), initial encounter: Secondary | ICD-10-CM | POA: Insufficient documentation

## 2015-06-04 ENCOUNTER — Ambulatory Visit: Payer: Worker's Compensation | Attending: Orthopedic Surgery | Admitting: Occupational Therapy

## 2015-06-04 DIAGNOSIS — M25632 Stiffness of left wrist, not elsewhere classified: Secondary | ICD-10-CM | POA: Insufficient documentation

## 2015-06-04 DIAGNOSIS — M79632 Pain in left forearm: Secondary | ICD-10-CM | POA: Diagnosis present

## 2015-06-04 DIAGNOSIS — M6281 Muscle weakness (generalized): Secondary | ICD-10-CM | POA: Diagnosis present

## 2015-06-04 NOTE — Patient Instructions (Signed)
Heat , AROM for wrist in all planes but pain free - stop before pain increase  Soft neoprene splint when using hand - but off if resting and sleeping  Massage forearm

## 2015-06-04 NOTE — Therapy (Signed)
[facility]ICINE [address] [place], [address] Phone: [phone]   Fax:  [phone]  Occupational Therapy Treatment  Patient Details  Name: Sandra Cantrell MRN: [id number] Date of Birth: [birth year] Referring Provider: Merlyn Cantrell  Encounter Date: 06/04/2015      OT End of Session - 06/04/15 1715    Visit Number 1   Number of Visits 12   Date for OT Re-Evaluation 07/16/15   OT Start Time 1232   OT Stop Time 1325   OT Time Calculation (min) 53 min   Activity Tolerance Patient tolerated treatment well   Behavior During Therapy [facility] for tasks assessed/performed      Past Medical History  Diagnosis Date  . Post traumatic stress disorder (PTSD)   . Anxiety   . Depression     Past Surgical History  Procedure Laterality Date  . Tonsillectomy    . Dilation and curettage of uterus    . Tubal ligation    . Wrist arthroscopy Left 02/13/2015    Procedure: LEFT ARTHROSCOPY WRIST VOLAR GANGLION EXCISION;  Surgeon: Sandra Loa, MD;  Location: [facility];  Service: Orthopedics;  Laterality: Left;  . Ganglion cyst excision Left 02/13/2015    Procedure: REMOVAL GANGLION OF WRIST;  Surgeon: Sandra Loa, MD;  Location: [facility];  Service: Orthopedics;  Laterality: Left;    There were no vitals filed for this visit.  Visit Diagnosis:  Stiffness of left wrist joint - Plan: Ot plan of care cert/re-cert  Pain of left forearm - Plan: Ot plan of care cert/re-cert  Muscle weakness - Plan: Ot plan of care cert/re-cert      Subjective Assessment - 06/04/15 1705    Subjective  I had therapy but it was to painfull , wanted to come back and see you - still cannot grip, carry, push up on hand, use in bathing and dressing - swelling when using it a Cantrell , cutting food ,    Patient Stated Goals Want to ride my motorcycle again , I love to fix things but without pain or difficulty, use my hand lke  before    Currently in Pain? Yes   Pain Score 4    Pain Location Wrist   Pain Orientation Left   Pain Descriptors / Indicators Aching;Burning   Pain Type Surgical pain   Pain Onset More than a month ago            [facility] OT Assessment - 06/04/15 0001    Assessment   Diagnosis L wrist scapholunate shrinkage, L volar ganglion excision    Referring Provider Sandra Cantrell   Onset Date 02/13/15   Assessment Pt refer to hand theapy/OT for ROM , gentle strengthening and desentization    Prior Therapy yes , for few wks in January in [place]   Balance Screen   Has the patient fallen in the past 6 months No   Has the patient had a decrease in activity level because of a fear of falling?  No   Home  Environment   Lives With Family   Prior Function   Level of Independence Independent   Vocation Workers comp   Leisure R hand dominant , likes to fix things around the house , ride motorcycle,    AROM   Left Forearm Pronation 85 Degrees   Left Forearm Supination 80 Degrees   Right Wrist Extension 70 Degrees   Right Wrist Flexion 80 Degrees  Right Wrist Radial Deviation 22 Degrees   Right Wrist Ulnar Deviation 42 Degrees   Left Wrist Extension 48 Degrees   Left Wrist Flexion 40 Degrees   Left Wrist Radial Deviation 14 Degrees   Left Wrist Ulnar Deviation 25 Degrees   Strength   Right Hand Grip (lbs) 75   Right Hand Lateral Pinch 17.5 lbs   Right Hand 3 Point Pinch 18 lbs   Left Hand Grip (lbs) 31   Left Hand Lateral Pinch 11 lbs   Left Hand 3 Point Pinch 8 lbs       Fluidotherapy done with AROM of wrist in all planes   prior to manual and review of HEP - pain decrease to about 1/10 from 5/10                     OT Education - 06/04/15 1715    Education provided Yes   Education Details HEP   Person(s) Educated Patient   Methods Explanation;Demonstration;Tactile cues;Verbal cues;Handout   Comprehension Verbal cues required;Returned demonstration;Verbalized  understanding          OT Short Term Goals - 06/04/15 1719    OT SHORT TERM GOAL #1   Title AROM of wrist improve to [facility] to return using hand fully in selfcare without increase symptoms    Baseline Wrist ext 48, flexion 40 , RD 14, UD 25    Time 3   Period Weeks   Status New   OT SHORT TERM GOAL #2   Title Pain on PRHWE improve by at least 15 points    Baseline pain on PRWHE 35/50   Time 3   Period Weeks   Status New   OT SHORT TERM GOAL #3   Title Pt to be ind in HEP to increase ROM and decrease pain   Time 4   Period Weeks   Status New           OT Long Term Goals - 06/04/15 1722    OT LONG TERM GOAL #1   Title Grip strength in L hand  increase by at least more than 50% compare to the R  to cut food and carry at least 8 lbs    Baseline L grip 31, R 75   Time 6   Period Weeks   Status New   OT LONG TERM GOAL #2   Title Prehension grip improve in L hand by at least 3 lbs to turn knobs    Baseline 3 point grip L 8 , R 18   Time 6   Period Weeks   Status New               Plan - 06/04/15 1716    Clinical Impression Statement Pt present 14 wks postop from Ganglion excision volar L wrist, and arthropy scapholunate ligament  shrinkage - pt cont to have pain , decrease ROM , strength  all limiting her functional use of L hand in ADL's and IADlL's    Pt will benefit from skilled therapeutic intervention in order to improve on the following deficits (Retired) Decreased range of motion;Impaired flexibility;Decreased scar mobility;Impaired UE functional use;Pain;Decreased strength;Increased edema   Rehab Potential Good   Clinical Impairments Affecting Rehab Potential Been about year ago injury and out of work    OT Frequency 2x / week   OT Duration 6 weeks   OT Treatment/Interventions Self-care/ADL training;Fluidtherapy;Splinting;Patient/family education;Therapeutic exercises;Therapeutic exercise;Scar mobilization;Manual Therapy;Parrafin;Passive range of motion    Plan Assess  pain , HEP - upgrade    OT Home Exercise Plan see pt instruction   Consulted and Agree with Plan of Care Patient        Problem List There are no active problems to display for this patient.   Sandra Cohn OTR/L,CLT 06/04/2015, 5:30 PM   [facility] PHYSICAL AND SPORTS MEDICINE [address] [place], [address] Phone: [phone]   Fax:  [phone]  Name: Sandra Cantrell MRN: [id number] Date of Birth: [birth year]

## 2015-06-06 ENCOUNTER — Ambulatory Visit: Payer: Worker's Compensation | Admitting: Occupational Therapy

## 2015-06-06 DIAGNOSIS — M25632 Stiffness of left wrist, not elsewhere classified: Secondary | ICD-10-CM | POA: Diagnosis not present

## 2015-06-06 DIAGNOSIS — M79632 Pain in left forearm: Secondary | ICD-10-CM

## 2015-06-06 DIAGNOSIS — M6281 Muscle weakness (generalized): Secondary | ICD-10-CM

## 2015-06-06 NOTE — Therapy (Signed)
[facility] PHYSICAL [facility] SPORTS MEDICINE [address] [place], [address] Phone: [phone]   Fax:  [phone]  Occupational Therapy Treatment  Patient Details  Name: Sandra Cantrell MRN: [id number] Date of Birth: 21-Aug-1965 Referring Provider: Merlyn Lot  Encounter Date: 06/06/2015      OT End of Session - 06/06/15 1045    Visit Number 2   Number of Visits 12   Date for OT Re-Evaluation 07/16/15   OT Start Time 0950   OT Stop Time 1040   OT Time Calculation (min) 50 min   Activity Tolerance Patient tolerated treatment well   Behavior During Therapy [facility] for tasks assessed/performed      Past Medical History  Diagnosis Date  . Post traumatic stress disorder (PTSD)   . Anxiety   . Depression     Past Surgical History  Procedure Laterality Date  . Tonsillectomy    . Dilation and curettage of uterus    . Tubal ligation    . Wrist arthroscopy Left 02/13/2015    Procedure: LEFT ARTHROSCOPY WRIST VOLAR GANGLION EXCISION;  Surgeon: Betha Loa, MD;  Location: [facility];  Service: Orthopedics;  Laterality: Left;  . Ganglion cyst excision Left 02/13/2015    Procedure: REMOVAL GANGLION OF WRIST;  Surgeon: Betha Loa, MD;  Location: [facility];  Service: Orthopedics;  Laterality: Left;    There were no vitals filed for this visit.  Visit Diagnosis:  Pain of left forearm  Stiffness of left wrist joint  Muscle weakness      Subjective Assessment - 06/06/15 1039    Subjective  My thumb huritng today - but I know I used it to much - we are renovating the trailer and it is cold today  with rain   Patient Stated Goals Want to ride my motorcycle again , I love to fix things but without pain or difficulty, use my hand lke before    Currently in Pain? Yes   Pain Score 7    Pain Location Wrist   Pain Orientation Left   Pain Descriptors / Indicators Aching            [facility] OT Assessment - 06/06/15  0001    AROM   Left Wrist Extension 55 Degrees  end   Left Wrist Flexion 55 Degrees  end                  OT Treatments/Exercises (OP) - 06/06/15 0001    Wrist Exercises   Other wrist exercises AAROM for wrist flexion and ext, RD and UD , AROM for wrist flexion and extention    Other wrist exercises  place and hold wrist ext losse fist , wrist flexion AROM - add to HEP- pain free range   LUE Fluidotherapy   Number Minutes Fluidotherapy 15 Minutes   LUE Fluidotherapy Location Hand;Wrist   Comments AROM at [facility] to decrease pain at thumb and wrist andincrease ROM - pain decrease to 2/10   Manual Therapy   Manual therapy comments Soft tissue mobs with Graston tool using 2, 3 and 4 in palm on thenar and hypethenar eminence , webspace ,  palmr scar , and then volar and dorsal forearm - sweeping, scooping , fannint and brushing                OT Education - 06/06/15 1044    Education provided Yes   Education Details HEP   Person(s) Educated Patient  Methods Explanation;Demonstration;Tactile cues;Verbal cues   Comprehension Verbalized understanding;Returned demonstration          OT Short Term Goals - 06/04/15 1719    OT SHORT TERM GOAL #1   Title AROM of wrist improve to [facility] to return using hand fully in selfcare without increase symptoms    Baseline Wrist ext 48, flexion 40 , RD 14, UD 25    Time 3   Period Weeks   Status New   OT SHORT TERM GOAL #2   Title Pain on PRHWE improve by at least 15 points    Baseline pain on PRWHE 35/50   Time 3   Period Weeks   Status New   OT SHORT TERM GOAL #3   Title Pt to be ind in HEP to increase ROM and decrease pain   Time 4   Period Weeks   Status New           OT Long Term Goals - 06/04/15 1722    OT LONG TERM GOAL #1   Title Grip strength in L hand  increase by at least more than 50% compare to the R  to cut food and carry at least 8 lbs    Baseline L grip 31, R 75   Time 6   Period Weeks   Status New    OT LONG TERM GOAL #2   Title Prehension grip improve in L hand by at least 3 lbs to turn knobs    Baseline 3 point grip L 8 , R 18   Time 6   Period Weeks   Status New               Plan - 06/06/15 1045    Clinical Impression Statement Pt cont to have pain at wrist , but increase this am at [facility] of thumb - ed pt to use palm and forearm - no rely on lateral grip - add PROM and place and hold wrist exercises  - pain free range    Pt will benefit from skilled therapeutic intervention in order to improve on the following deficits (Retired) Decreased range of motion;Impaired flexibility;Decreased scar mobility;Impaired UE functional use;Pain;Decreased strength;Increased edema   Rehab Potential Good   OT Frequency 2x / week   OT Duration 6 weeks   OT Treatment/Interventions Self-care/ADL training;Fluidtherapy;Splinting;Patient/family education;Therapeutic exercises;Therapeutic exercise;Scar mobilization;Manual Therapy;Parrafin;Passive range of motion   Plan upgrade , pain decrease and increase ROM    OT Home Exercise Plan see pt instruction   Consulted and Agree with Plan of Care Patient        Problem List There are no active problems to display for this patient.   Oletta Cohn OTR/L,CLT 06/06/2015, 10:47 AM  [place] [facility] PHYSICAL AND SPORTS MEDICINE [address] [place], [address] Phone: [phone]   Fax:  [phone]  Name: Sandra Cantrell MRN: [id number] Date of Birth: [birth year]

## 2015-06-06 NOTE — Patient Instructions (Signed)
Add PROM for flexion and extention at wrist  Place and hold for wrist flexion and extention  Cont with others   use heat prior and keep pain under 2/10

## 2015-06-10 ENCOUNTER — Encounter: Payer: Worker's Compensation | Admitting: Occupational Therapy

## 2015-06-11 ENCOUNTER — Ambulatory Visit: Payer: Worker's Compensation | Admitting: Occupational Therapy

## 2015-06-13 ENCOUNTER — Ambulatory Visit: Payer: Worker's Compensation | Admitting: Occupational Therapy

## 2015-06-13 DIAGNOSIS — M25632 Stiffness of left wrist, not elsewhere classified: Secondary | ICD-10-CM

## 2015-06-13 DIAGNOSIS — M79632 Pain in left forearm: Secondary | ICD-10-CM

## 2015-06-13 DIAGNOSIS — M6281 Muscle weakness (generalized): Secondary | ICD-10-CM

## 2015-06-13 NOTE — Therapy (Signed)
[place] [facility] PHYS[facility]TS MEDICINE [address] [place], [address] Phone: [phone]   Fax:  [phone]  Occupational Therapy Treatment  Patient Details  Name: Sandra Cantrell MRN: [id number] Date of Birth: [birth year] Referring Provider: Merlyn Lot  Encounter Date: 06/13/2015      OT End of Session - 06/13/15 1119    Visit Number 3   Number of Visits 12   Date for OT Re-Evaluation 07/16/15   OT Start Time 1005   OT Stop Time 1057   OT Time Calculation (min) 52 min   Activity Tolerance Patient tolerated treatment well   Behavior During Therapy [facility] for tasks assessed/performed      Past Medical History  Diagnosis Date  . Post traumatic stress disorder (PTSD)   . Anxiety   . Depression     Past Surgical History  Procedure Laterality Date  . Tonsillectomy    . Dilation and curettage of uterus    . Tubal ligation    . Wrist arthroscopy Left 02/13/2015    Procedure: LEFT ARTHROSCOPY WRIST VOLAR GANGLION EXCISION;  Surgeon: Betha Loa, MD;  Location: [facility];  Service: Orthopedics;  Laterality: Left;  . Ganglion cyst excision Left 02/13/2015    Procedure: REMOVAL GANGLION OF WRIST;  Surgeon: Betha Loa, MD;  [facility];  Service: Orthopedics;  Laterality: Left;    There were no vitals filed for this visit.  Visit Diagnosis:  Stiffness of left wrist joint  Pain of left forearm  Muscle weakness      Subjective Assessment - 06/13/15 1020    Subjective  Seen Dr yesterday - he said there was another cyst but he did not do that one because felt like it was not the one that was bothering me - feels like therapy is helpling - more range of motions , less pain and using it more    Patient Stated Goals Want to ride my motorcycle again , I love to fix things but without pain or difficulty, use my hand lke before    Currently in Pain? Yes   Pain Score 2    Pain Location Wrist   Pain  Orientation Left   Pain Descriptors / Indicators Aching   Pain Type Surgical pain                      OT Treatments/Exercises (OP) - 06/13/15 0001    Wrist Exercises   Other wrist exercises AAROM for wrist flexion and extention - 1 lbs for wrist flexion , extention, SUp/pro , and RD and UD    Other wrist exercises UBE 6 min BW and FW every 1 min change   Ultrasound   Ultrasound Location Ulnar wrist distal to ulnar stylioed    Ultrasound Parameters [place] at 20% , 1.0 intensity and 20%  for 4 mi   Ultrasound Goals Pain   LUE Fluidotherapy   Number Minutes Fluidotherapy 12 Minutes   LUE Fluidotherapy Location Hand;Wrist   Comments AROM for wrist at [facility] to increase ROM and decrease pain    Manual Therapy   Manual therapy comments Soft tissue mobs with Graston tool using 2, 3 and 4 on  palmr scar , and then volar and dorsal forearm - sweeping, scooping , fannint and brushing                OT Education - 06/13/15 1119    Education provided Yes  Education Details HEP   Person(s) Educated Patient   Methods Demonstration;Tactile cues;Explanation;Verbal cues   Comprehension Verbalized understanding;Returned demonstration;Verbal cues required          OT Short Term Goals - 06/04/15 1719    OT SHORT TERM GOAL #1   Title AROM of wrist improve to [facility] to return using hand fully in selfcare without increase symptoms    Baseline Wrist ext 48, flexion 40 , RD 14, UD 25    Time 3   Period Weeks   Status New   OT SHORT TERM GOAL #2   Title Pain on PRHWE improve by at least 15 points    Baseline pain on PRWHE 35/50   Time 3   Period Weeks   Status New   OT SHORT TERM GOAL #3   Title Pt to be ind in HEP to increase ROM and decrease pain   Time 4   Period Weeks   Status New           OT Long Term Goals - 06/04/15 1722    OT LONG TERM GOAL #1   Title Grip strength in L hand  increase by at least more than 50% compare to the R  to cut food and carry at least 8  lbs    Baseline L grip 31, R 75   Time 6   Period Weeks   Status New   OT LONG TERM GOAL #2   Title Prehension grip improve in L hand by at least 3 lbs to turn knobs    Baseline 3 point grip L 8 , R 18   Time 6   Period Weeks   Status New               Plan - 06/13/15 1119    Clinical Impression Statement Pt arrive with more of achiness and stiffness at wrist - report when she taouch ulnar side of wrist - little shokcs or pain like nerve pain over top of hand - and pain mostly on ulnar side of forearm - pt to initiated this week strengthtning -  pt  has some tirigger points /spasm over palmar forearm and extensormass of fforearm  - pt to keep pain under 1-2/10 during day with use    Pt will benefit from skilled therapeutic intervention in order to improve on the following deficits (Retired) Decreased range of motion;Impaired flexibility;Decreased scar mobility;Impaired UE functional use;Pain;Decreased strength;Increased edema   Rehab Potential Good   Clinical Impairments Affecting Rehab Potential Been about year ago injury and out of work    OT Frequency 2x / week   OT Duration 4 weeks   OT Treatment/Interventions Self-care/ADL training;Fluidtherapy;Splinting;Patient/family education;Therapeutic exercises;Therapeutic exercise;Scar mobilization;Manual Therapy;Parrafin;Passive range of motion   Plan assess pain , HEP doing -    OT Home Exercise Plan see pt instruction   Consulted and Agree with Plan of Care Patient        Problem List There are no active problems to display for this patient.   Oletta Cohn OTR/L,CLT 06/13/2015, 11:23 AM  [place] [facility] PHYSICAL AND SPORTS MEDICINE [address], [place], [address] Phone: [phone]   Fax:  [phone]  Name: Sandra Cantrell MRN: [id number] Date of Birth: Sep 09, 1965

## 2015-06-13 NOTE — Patient Instructions (Signed)
Wrist 1 lbs for wrist in all planes - pain should stay under 2/10  Do heat and ice as needed  Benik neoprene splint wear with activities

## 2015-06-16 ENCOUNTER — Ambulatory Visit: Payer: Worker's Compensation | Admitting: Occupational Therapy

## 2015-06-16 DIAGNOSIS — M79632 Pain in left forearm: Secondary | ICD-10-CM

## 2015-06-16 DIAGNOSIS — M6281 Muscle weakness (generalized): Secondary | ICD-10-CM

## 2015-06-16 DIAGNOSIS — M25632 Stiffness of left wrist, not elsewhere classified: Secondary | ICD-10-CM

## 2015-06-16 NOTE — Therapy (Signed)
[facility] PHYS[facility] SPORTS MEDICINE [address] [place], [address] Phone: [phone]   Fax:  [phone]  Occupational Therapy Treatment  Patient Details  Name: Sandra Cantrell MRN: [id number] Date of Birth: 1966-03-11 Referring Provider: Merlyn Lot  Encounter Date: 06/16/2015      OT End of Session - 06/16/15 1506    Visit Number 4   Number of Visits 12   Date for OT Re-Evaluation 07/16/15   OT Start Time 0846   OT Stop Time 0940   OT Time Calculation (min) 54 min   Activity Tolerance Patient tolerated treatment well   Behavior During Therapy [facility] for tasks assessed/performed      Past Medical History  Diagnosis Date  . Post traumatic stress disorder (PTSD)   . Anxiety   . Depression     Past Surgical History  Procedure Laterality Date  . Tonsillectomy    . Dilation and curettage of uterus    . Tubal ligation    . Wrist arthroscopy Left 02/13/2015    Procedure: LEFT ARTHROSCOPY WRIST VOLAR GANGLION EXCISION;  Surgeon: Betha Loa, MD;  Location: [facility];  Service: Orthopedics;  Laterality: Left;  . Ganglion cyst excision Left 02/13/2015    Procedure: REMOVAL GANGLION OF WRIST;  Surgeon: Betha Loa, MD;  Location: [facility];  Service: Orthopedics;  Laterality: Left;    There were no vitals filed for this visit.  Visit Diagnosis:  Stiffness of left wrist joint  Pain of left forearm  Muscle weakness      Subjective Assessment - 06/16/15 0855    Subjective  Doing  okay - I did not do as much this weekend than other times- did get flash light that is about one pound to exercises - still pain but okay - hurting more with colder weather    Patient Stated Goals Want to ride my motorcycle again , I love to fix things but without pain or difficulty, use my hand lke before    Currently in Pain? Yes   Pain Score 4    Pain Location Wrist   Pain Orientation Left   Pain Descriptors /  Indicators Aching   Pain Type Surgical pain   Pain Onset More than a month ago                      OT Treatments/Exercises (OP) - 06/16/15 0001    Wrist Exercises   Other wrist exercises Wrist 1 lbs for Sup/pro, wrist flexion and extention 10 reps    Other wrist exercises RD to neutral , 10 reps , then UD to neutral 1 0reps - but not increase pain over dorsal hand    Hand Exercises   Other Hand Exercises teal putty for gripping  and pulling with all digits - but light - not increasing tingling over dorsal hand    LUE Fluidotherapy   Number Minutes Fluidotherapy 12 Minutes   LUE Fluidotherapy Location Hand;Wrist   Comments AROM at [facility] to decrease pain and increase ROM    Manual Therapy   Manual therapy comments Soft tissue mobs with Graston tool using 2, 3 and 4 on  palmr scar , and then volar and dorsal forearm - sweeping, scooping , fannint and brushing                OT Education - 06/16/15 1505    Education provided Yes   Education Details HEP  Person(s) Educated Patient   Methods Explanation;Demonstration;Tactile cues;Verbal cues   Comprehension Verbal cues required;Returned demonstration;Verbalized understanding          OT Short Term Goals - 06/04/15 1719    OT SHORT TERM GOAL #1   Title AROM of wrist improve to [facility] to return using hand fully in selfcare without increase symptoms    Baseline Wrist ext 48, flexion 40 , RD 14, UD 25    Time 3   Period Weeks   Status New   OT SHORT TERM GOAL #2   Title Pain on PRHWE improve by at least 15 points    Baseline pain on PRWHE 35/50   Time 3   Period Weeks   Status New   OT SHORT TERM GOAL #3   Title Pt to be ind in HEP to increase ROM and decrease pain   Time 4   Period Weeks   Status New           OT Long Term Goals - 06/04/15 1722    OT LONG TERM GOAL #1   Title Grip strength in L hand  increase by at least more than 50% compare to the R  to cut food and carry at least 8 lbs     Baseline L grip 31, R 75   Time 6   Period Weeks   Status New   OT LONG TERM GOAL #2   Title Prehension grip improve in L hand by at least 3 lbs to turn knobs    Baseline 3 point grip L 8 , R 18   Time 6   Period Weeks   Status New               Plan - 06/16/15 1506    Clinical Impression Statement Pt cont to have pain on dorsal forearm , ulnar side of wrist with tapping or pressure over that area some tingling over dorsal hand - able to add putty this date for grip and pulling all digits - keep pain about 1-2/10 during HEP    Pt will benefit from skilled therapeutic intervention in order to improve on the following deficits (Retired) Decreased range of motion;Impaired flexibility;Decreased scar mobility;Impaired UE functional use;Pain;Decreased strength;Increased edema   Rehab Potential Good   Clinical Impairments Affecting Rehab Potential Been about year ago injury and out of work    OT Frequency 2x / week   OT Duration 4 weeks   OT Treatment/Interventions Self-care/ADL training;Fluidtherapy;Splinting;Patient/family education;Therapeutic exercises;Therapeutic exercise;Scar mobilization;Manual Therapy;Parrafin;Passive range of motion   Plan assess pain , HEP    OT Home Exercise Plan see pt instruction   Consulted and Agree with Plan of Care Patient        Problem List There are no active problems to display for this patient.   Oletta Cohn OTR/L,CLT  06/16/2015, 3:09 PM  [facility] PHYSICAL AND SPORTS MEDICINE [address] [place], [address] Phone: [phone]   Fax:  [phone]  Name: DELORES EDELSTEIN MRN: [id number] Date of Birth: [birth year]

## 2015-06-16 NOTE — Patient Instructions (Signed)
Cont with same 1 lbs but do RD to neutral , and then UD to neutral - without increase pain - and 10 reps  Sup/pro,flexion,ext same   Teal putty for gripping and pulling with all digits  But not to tight and no increase pain and slight pull  10 reps each  Hold 2 sec

## 2015-06-19 ENCOUNTER — Ambulatory Visit: Payer: Worker's Compensation | Admitting: Occupational Therapy

## 2015-06-19 DIAGNOSIS — M79632 Pain in left forearm: Secondary | ICD-10-CM

## 2015-06-19 DIAGNOSIS — M6281 Muscle weakness (generalized): Secondary | ICD-10-CM

## 2015-06-19 DIAGNOSIS — M25632 Stiffness of left wrist, not elsewhere classified: Secondary | ICD-10-CM | POA: Diagnosis not present

## 2015-06-19 NOTE — Therapy (Signed)
[facility] PHYSICAL [facility] SPORTS MEDICINE [address], [place], [address] Phone: [phone]   Fax:  [phone]  Occupational Therapy Treatment  Patient Details  Name: Sandra Cantrell MRN: [id number] Date of Birth: [birth year] Referring Provider: Merlyn Lot  Encounter Date: 06/19/2015      OT End of Session - 06/19/15 1455    Visit Number 5   Number of Visits 12   Date for OT Re-Evaluation 07/16/15   OT Start Time 1434   OT Stop Time 1530   OT Time Calculation (min) 56 min   Activity Tolerance Patient tolerated treatment well   Behavior During Therapy [facility] for tasks assessed/performed      Past Medical History  Diagnosis Date  . Post traumatic stress disorder (PTSD)   . Anxiety   . Depression     Past Surgical History  Procedure Laterality Date  . Tonsillectomy    . Dilation and curettage of uterus    . Tubal ligation    . Wrist arthroscopy Left 02/13/2015    Procedure: LEFT ARTHROSCOPY WRIST VOLAR GANGLION EXCISION;  Surgeon: Betha Loa, MD;  Location: [facility];  Service: Orthopedics;  Laterality: Left;  . Ganglion cyst excision Left 02/13/2015    Procedure: REMOVAL GANGLION OF WRIST;  Surgeon: Betha Loa, MD;  Location: [facility];  Service: Orthopedics;  Laterality: Left;    There were no vitals filed for this visit.  Visit Diagnosis:  Stiffness of left wrist joint  Pain of left forearm  Muscle weakness      Subjective Assessment - 06/19/15 1447    Subjective  My wrist is hurting today - I think I picked up something that I should have not - groceries yesterday and it pulled on my wrist     Patient Stated Goals Want to ride my motorcycle again , I love to fix things but without pain or difficulty, use my hand lke before    Currently in Pain? Yes   Pain Score 7    Pain Location Wrist   Pain Orientation Left   Pain Descriptors / Indicators Aching   Pain Type Surgical pain   Pain  Onset More than a month ago                      OT Treatments/Exercises (OP) - 06/19/15 0001    Wrist Exercises   Other wrist exercises 1 lbs weight 2 sets for wrist flexion in pronation position, RD/UD in standing on side - less pain - sup/pro  10 reps each    Other wrist exercises UBE 6 min  BW and FW every min    Hand Exercises   Other Hand Exercises Teal putty for grip , lat grip ,    LUE Fluidotherapy   Number Minutes Fluidotherapy 10 Minutes   LUE Fluidotherapy Location Hand;Wrist   Comments AROM for wrist in all planes at [facility] to decrease pain    Manual Therapy   Manual therapy comments Soft tissue mobs with Graston tool using 2, 3 and 4 on  palmr scar , and then volar and dorsal forearm - sweeping, scooping , fannint and brushing                OT Education - 06/19/15 1455    Education provided Yes   Education Details HEP   Person(s) Educated Patient   Methods Explanation;Demonstration;Tactile cues;Verbal cues   Comprehension Verbal cues required;Returned demonstration;Verbalized understanding  OT Short Term Goals - 06/04/15 1719    OT SHORT TERM GOAL #1   Title AROM of wrist improve to [facility] to return using hand fully in selfcare without increase symptoms    Baseline Wrist ext 48, flexion 40 , RD 14, UD 25    Time 3   Period Weeks   Status New   OT SHORT TERM GOAL #2   Title Pain on PRHWE improve by at least 15 points    Baseline pain on PRWHE 35/50   Time 3   Period Weeks   Status New   OT SHORT TERM GOAL #3   Title Pt to be ind in HEP to increase ROM and decrease pain   Time 4   Period Weeks   Status New           OT Long Term Goals - 06/04/15 1722    OT LONG TERM GOAL #1   Title Grip strength in L hand  increase by at least more than 50% compare to the R  to cut food and carry at least 8 lbs    Baseline L grip 31, R 75   Time 6   Period Weeks   Status New   OT LONG TERM GOAL #2   Title Prehension grip improve in L  hand by at least 3 lbs to turn knobs    Baseline 3 point grip L 8 , R 18   Time 6   Period Weeks   Status New               Plan - 06/19/15 1942    Clinical Impression Statement Pt cont to have pain in ulnar side of wrist - increase pain UD and RD - and wrist extention with palm down - less pain with sup/pro and wrist flexion - able to increase to 2 sets and increase putty  HEP    Pt will benefit from skilled therapeutic intervention in order to improve on the following deficits (Retired) Decreased range of motion;Impaired flexibility;Decreased scar mobility;Impaired UE functional use;Pain;Decreased strength;Increased edema   Rehab Potential Good   Clinical Impairments Affecting Rehab Potential Been about year ago injury and out of work    OT Frequency 2x / week   OT Duration 4 weeks   OT Treatment/Interventions Self-care/ADL training;Fluidtherapy;Splinting;Patient/family education;Therapeutic exercises;Therapeutic exercise;Scar mobilization;Manual Therapy;Parrafin;Passive range of motion   Plan assess pain , HEP and increase strenght   OT Home Exercise Plan see pt instruction   Consulted and Agree with Plan of Care Patient        Problem List There are no active problems to display for this patient.   Oletta Cohn OTR/l,CLT 06/19/2015, 7:45 PM  [facility] PHYSICAL AND SPORTS MEDICINE [address], [place], [address] Phone: [phone]   Fax:  [phone]  Name: EATHER CHAIRES MRN: [id number] Date of Birth: [birth year]

## 2015-06-25 ENCOUNTER — Ambulatory Visit: Payer: Worker's Compensation | Admitting: Occupational Therapy

## 2015-06-27 ENCOUNTER — Ambulatory Visit: Payer: Worker's Compensation | Admitting: Occupational Therapy

## 2015-06-27 DIAGNOSIS — M6281 Muscle weakness (generalized): Secondary | ICD-10-CM

## 2015-06-27 DIAGNOSIS — M25632 Stiffness of left wrist, not elsewhere classified: Secondary | ICD-10-CM

## 2015-06-27 DIAGNOSIS — M79632 Pain in left forearm: Secondary | ICD-10-CM

## 2015-06-27 NOTE — Therapy (Signed)
[facility] PHYS[facility]ORTS MEDICINE [address], [place], [address] Phone: [phone]   Fax:  [phone]  Occupational Therapy Treatment  Patient Details  Name: Sandra Cantrell MRN: [id number] Date of Birth: Jun 07, 1965 Referring Provider: Merlyn Lot  Encounter Date: 06/27/2015      OT End of Session - 06/27/15 1828    Visit Number 5   Number of Visits 12   Date for OT Re-Evaluation 07/16/15   OT Start Time 1126   OT Stop Time 1224   OT Time Calculation (min) 58 min   Activity Tolerance Patient tolerated treatment well   Behavior During Therapy [facility] for tasks assessed/performed      Past Medical History  Diagnosis Date  . Post traumatic stress disorder (PTSD)   . Anxiety   . Depression     Past Surgical History  Procedure Laterality Date  . Tonsillectomy    . Dilation and curettage of uterus    . Tubal ligation    . Wrist arthroscopy Left 02/13/2015    Procedure: LEFT ARTHROSCOPY WRIST VOLAR GANGLION EXCISION;  Surgeon: Betha Loa, MD;  Location: [facility];  Service: Orthopedics;  Laterality: Left;  . Ganglion cyst excision Left 02/13/2015    Procedure: REMOVAL GANGLION OF WRIST;  Surgeon: Betha Loa, MD;  Location: [facility];  Service: Orthopedics;  Laterality: Left;    There were no vitals filed for this visit.  Visit Diagnosis:  Stiffness of left wrist joint  Pain of left forearm  Muscle weakness      Subjective Assessment - 06/27/15 1821    Subjective  I know now why I like the warmet weather - pain not as bad  - my motion better and pain not as often - but strength with certain activities still weak    Patient Stated Goals Want to ride my motorcycle again , I love to fix things but without pain or difficulty, use my hand lke before    Currently in Pain? Yes   Pain Score 2    Pain Location Wrist   Pain Orientation Left   Pain Descriptors / Indicators Aching   Pain Type  Surgical pain                      OT Treatments/Exercises (OP) - 06/27/15 0001    Wrist Exercises   Other wrist exercises 2 lbs for wrist in all planes this date    Hand Exercises   Other Hand Exercises Teal putty for grip , lat grip ,    Other Hand Exercises Rubber band for PA and putty for RA - pulling putty and twisting this date    Electrical Stimulation   Electrical Stimulation Location ulnar wrist and hand    Electrical Stimulation Action 2cm patch ulnar hand and proximal wrist ulnar side    Electrical Stimulation Parameters protocol for acute pain , 6.6 volt - 10 min at end of session    Electrical Stimulation Goals Pain   LUE Fluidotherapy   Number Minutes Fluidotherapy 10 Minutes   LUE Fluidotherapy Location Hand;Wrist   Comments AROM at [facility] to decrease pain    Manual Therapy   Manual therapy comments Soft tissue mobs with Graston tool using 2, 3 and 4 on  palmr scar , and then volar and dorsal forearm - sweeping, scooping , fannint and brushing  OT Education - 06/27/15 1828    Education provided Yes   Education Details HEP   Person(s) Educated Patient   Methods Explanation;Demonstration;Tactile cues;Verbal cues   Comprehension Verbal cues required;Returned demonstration;Verbalized understanding          OT Short Term Goals - 06/04/15 1719    OT SHORT TERM GOAL #1   Title AROM of wrist improve to [facility] to return using hand fully in selfcare without increase symptoms    Baseline Wrist ext 48, flexion 40 , RD 14, UD 25    Time 3   Period Weeks   Status New   OT SHORT TERM GOAL #2   Title Pain on PRHWE improve by at least 15 points    Baseline pain on PRWHE 35/50   Time 3   Period Weeks   Status New   OT SHORT TERM GOAL #3   Title Pt to be ind in HEP to increase ROM and decrease pain   Time 4   Period Weeks   Status New           OT Long Term Goals - 06/04/15 1722    OT LONG TERM GOAL #1   Title Grip strength in L  hand  increase by at least more than 50% compare to the R  to cut food and carry at least 8 lbs    Baseline L grip 31, R 75   Time 6   Period Weeks   Status New   OT LONG TERM GOAL #2   Title Prehension grip improve in L hand by at least 3 lbs to turn knobs    Baseline 3 point grip L 8 , R 18   Time 6   Period Weeks   Status New               Plan - 06/27/15 1829    Clinical Impression Statement Pt cont to have pain but able to tolerate increase strengthenning  - pt cont to have pain at base of thumb and burning from ulnar side of wrist to dorsal hand - pt fitted with neoprene wristt and thumb wrap to use at home - and increaseto 2lbs but same putty    Pt will benefit from skilled therapeutic intervention in order to improve on the following deficits (Retired) Decreased range of motion;Impaired flexibility;Decreased scar mobility;Impaired UE functional use;Pain;Decreased strength;Increased edema   Rehab Potential Good   Clinical Impairments Affecting Rehab Potential Been about year ago injury and out of work    OT Frequency 2x / week   OT Duration 4 weeks   OT Treatment/Interventions Self-care/ADL training;Fluidtherapy;Splinting;Patient/family education;Therapeutic exercises;Therapeutic exercise;Scar mobilization;Manual Therapy;Parrafin;Passive range of motion   Plan assess pain , HEP and increase strength    OT Home Exercise Plan see pt instruction   Consulted and Agree with Plan of Care Patient        Problem List There are no active problems to display for this patient.   Oletta Cohn OTR/L,CLT  06/27/2015, 6:32 PM  [facility] PHYSICAL AND SPORTS MEDICINE [address] [place], [address] Phone: [phone]   Fax:  [phone]  Name: JANUS VLCEK MRN: [id number] Date of Birth: 1965/08/19

## 2015-06-27 NOTE — Patient Instructions (Signed)
Increase HEP to 2 lbs  10 reps 2 x day   Putty teal grip ,lat , 3 point , pulling and twisting   Wrist and thumb wrap fitted this date instead of wrist Benik

## 2015-07-01 ENCOUNTER — Ambulatory Visit: Payer: Worker's Compensation | Admitting: Occupational Therapy

## 2015-07-01 DIAGNOSIS — M25632 Stiffness of left wrist, not elsewhere classified: Secondary | ICD-10-CM | POA: Diagnosis not present

## 2015-07-01 DIAGNOSIS — M79632 Pain in left forearm: Secondary | ICD-10-CM

## 2015-07-01 DIAGNOSIS — M6281 Muscle weakness (generalized): Secondary | ICD-10-CM

## 2015-07-01 NOTE — Patient Instructions (Signed)
Same - will upgrade next session

## 2015-07-01 NOTE — Therapy (Signed)
[facility] PHYSICAL AND SPORTS MEDICINE [address] [place], [address] Phone: [phone]   Fax:  [phone]  Occupational Therapy Treatment  Patient Details  Name: Sandra Cantrell MRN: [id number] Date of Birth: 1966-03-28 Referring Provider: Merlyn Lot  Encounter Date: 07/01/2015      OT End of Session - 07/01/15 1749    Visit Number 6   Number of Visits 12   Date for OT Re-Evaluation 07/16/15   OT Start Time 1315   OT Stop Time 1413   OT Time Calculation (min) 58 min   Activity Tolerance Patient tolerated treatment well   Behavior During Therapy [facility] for tasks assessed/performed      Past Medical History  Diagnosis Date  . Post traumatic stress disorder (PTSD)   . Anxiety   . Depression     Past Surgical History  Procedure Laterality Date  . Tonsillectomy    . Dilation and curettage of uterus    . Tubal ligation    . Wrist arthroscopy Left 02/13/2015    Procedure: LEFT ARTHROSCOPY WRIST VOLAR GANGLION EXCISION;  Surgeon: Betha Loa, MD;  Location: [facility];  Service: Orthopedics;  Laterality: Left;  . Ganglion cyst excision Left 02/13/2015    Procedure: REMOVAL GANGLION OF WRIST;  Surgeon: Betha Loa, MD;  Location: [facility];  Service: Orthopedics;  Laterality: Left;    There were no vitals filed for this visit.  Visit Diagnosis:  Stiffness of left wrist joint  Pain of left forearm  Muscle weakness      Subjective Assessment - 07/01/15 1744    Subjective  I tried to not use it to much over the weekend - the new soft splnt helps my thumb and side of hand on pinkie side more - pain better  I would say - but more at thumb side    Patient Stated Goals Want to ride my motorcycle again , I love to fix things but without pain or difficulty, use my hand lke before    Currently in Pain? Yes   Pain Score 2    Pain Location Wrist   Pain Orientation Left   Pain Descriptors / Indicators  Aching                      OT Treatments/Exercises (OP) - 07/01/15 0001    Wrist Exercises   Other wrist exercises 2 lbs for wrist in all planes this date    Hand Exercises   Other Hand Exercises Green putty for gripping and pulling    Other Hand Exercises Teal putty for RA , rubber band for PA - also did to thumb in all planes at start and end  isometric strength    Electrical Stimulation   Electrical Stimulation Location Thumb and  at  radial wrist    Electrical Stimulation Action 2 cm patch    Electrical Stimulation Parameters protocol for acute pain , 6.0 current    Electrical Stimulation Goals Pain   LUE Fluidotherapy   Number Minutes Fluidotherapy 10 Minutes   LUE Fluidotherapy Location Hand;Wrist   Comments AROM for wrist and thumb in all planes - decrease pain and increase ROM    Manual Therapy   Manual therapy comments Soft tissue mobs with Graston tool using 2,  and 4 on  palmr scar , and then volar and dorsal forearm - sweeping, scooping , fannint and brushing- had large trigger point on radial side of  midforearm with tenderness but decrease  afterrwards                 OT Education - 07/01/15 1749    Education provided Yes   Education Details HEP   Person(s) Educated Patient   Methods Explanation;Demonstration;Verbal cues;Tactile cues   Comprehension Returned demonstration;Verbalized understanding          OT Short Term Goals - 06/04/15 1719    OT SHORT TERM GOAL #1   Title AROM of wrist improve to [facility] to return using hand fully in selfcare without increase symptoms    Baseline Wrist ext 48, flexion 40 , RD 14, UD 25    Time 3   Period Weeks   Status New   OT SHORT TERM GOAL #2   Title Pain on PRHWE improve by at least 15 points    Baseline pain on PRWHE 35/50   Time 3   Period Weeks   Status New   OT SHORT TERM GOAL #3   Title Pt to be ind in HEP to increase ROM and decrease pain   Time 4   Period Weeks   Status New            OT Long Term Goals - 06/04/15 1722    OT LONG TERM GOAL #1   Title Grip strength in L hand  increase by at least more than 50% compare to the R  to cut food and carry at least 8 lbs    Baseline L grip 31, R 75   Time 6   Period Weeks   Status New   OT LONG TERM GOAL #2   Title Prehension grip improve in L hand by at least 3 lbs to turn knobs    Baseline 3 point grip L 8 , R 18   Time 6   Period Weeks   Status New               Plan - 07/01/15 1749    Clinical Impression Statement Pt cont to have pain but decrease after fludo - and did not increaes with putty or isometric - but  pain increased this date with supination - pt to cont with same HEP - and will upgrade next session and cont with neoprene thumb and wrist wrap    Pt will benefit from skilled therapeutic intervention in order to improve on the following deficits (Retired) Decreased range of motion;Impaired flexibility;Decreased scar mobility;Impaired UE functional use;Pain;Decreased strength;Increased edema   Rehab Potential Good   OT Frequency 2x / week   OT Duration 4 weeks   OT Treatment/Interventions Self-care/ADL training;Fluidtherapy;Splinting;Patient/family education;Therapeutic exercises;Therapeutic exercise;Scar mobilization;Manual Therapy;Parrafin;Passive range of motion   Plan assess pain , HEP    OT Home Exercise Plan see pt instruction        Problem List There are no active problems to display for this patient.   Oletta Cohn OTR/L,CLT  07/01/2015, 5:52 PM  [facility] PHYSICAL AND SPORTS MEDICINE [address], [place], [address] Phone: [phone]   Fax:  [phone]  Name: Sandra Cantrell MRN: [id number] Date of Birth: 02/11/1966

## 2015-07-03 ENCOUNTER — Ambulatory Visit: Payer: Worker's Compensation | Attending: Orthopedic Surgery | Admitting: Occupational Therapy

## 2015-07-03 DIAGNOSIS — M6281 Muscle weakness (generalized): Secondary | ICD-10-CM | POA: Diagnosis present

## 2015-07-03 DIAGNOSIS — M79632 Pain in left forearm: Secondary | ICD-10-CM | POA: Diagnosis present

## 2015-07-03 DIAGNOSIS — M25632 Stiffness of left wrist, not elsewhere classified: Secondary | ICD-10-CM | POA: Diagnosis not present

## 2015-07-03 NOTE — Therapy (Signed)
[place] [facility] PH[facility]ICINE [address] [place], [address] Phone: [phone]   Fax:  [phone]  Occupational Therapy Treatment  Patient Details  Name: Sandra Cantrell MRN: [id number] Date of Birth: [birth year] Referring Provider: Merlyn Lot  Encounter Date: 07/03/2015      OT End of Session - 07/03/15 1111    Visit Number 7   Number of Visits 12   Date for OT Re-Evaluation 07/16/15   OT Start Time 0935   OT Stop Time 1040   OT Time Calculation (min) 65 min   Activity Tolerance Patient tolerated treatment well   Behavior During Therapy [facility] for tasks assessed/performed      Past Medical History  Diagnosis Date  . Post traumatic stress disorder (PTSD)   . Anxiety   . Depression     Past Surgical History  Procedure Laterality Date  . Tonsillectomy    . Dilation and curettage of uterus    . Tubal ligation    . Wrist arthroscopy Left 02/13/2015    Procedure: LEFT ARTHROSCOPY WRIST VOLAR GANGLION EXCISION;  Surgeon: Betha Loa, MD;  Location: [facility];  Service: Orthopedics;  Laterality: Left;  . Ganglion cyst excision Left 02/13/2015    Procedure: REMOVAL GANGLION OF WRIST;  Surgeon: Betha Loa, MD;  Location: [facility];  Service: Orthopedics;  Laterality: Left;    There were no vitals filed for this visit.  Visit Diagnosis:  Stiffness of left wrist joint  Pain of left forearm  Muscle weakness      Subjective Assessment - 07/03/15 1107    Subjective  Feeling not to bad - about 1-2/10 at base of thumb and other side of wrist - but did not sleep to good the last 2 nights - but dad had to take him to ER the other night -   Patient Stated Goals Want to ride my motorcycle again , I love to fix things but without pain or difficulty, use my hand lke before    Currently in Pain? Yes   Pain Score 2    Pain Location Wrist   Pain Orientation Left   Pain Descriptors / Indicators  Aching                      OT Treatments/Exercises (OP) - 07/03/15 0001    Wrist Exercises   Other wrist exercises 2 lbs for wrist in all planes this date    Other wrist exercises Wrist maze with 1 lbs in all directions  of wrist 5 reps    Hand Exercises   Other Hand Exercises Green putty for gripping and pulling  2 sets of 12  reps    Electrical Stimulation   Electrical Stimulation Location Thumb and  at  radial wrist    Electrical Stimulation Action 2 cm patch at protocol for chronic pain  at 6.2 current    Electrical Stimulation Parameters 10 min    Electrical Stimulation Goals Pain   LUE Fluidotherapy   Number Minutes Fluidotherapy 10 Minutes   LUE Fluidotherapy Location Hand;Wrist   Comments AROM for fingers and wrist in all planes    Manual Therapy   Manual therapy comments Soft tissue mobs with Graston tool using 2,  and 4 on  palmr scar , and then volar and dorsal forearm - sweeping, scooping , fannint and brushing- had large trigger point on radial side of midforearm with tenderness but decrease  afterrwards  OT Education - 07/03/15 1111    Education provided Yes   Education Details HEP    Person(s) Educated Patient   Methods Explanation;Demonstration;Verbal cues;Tactile cues   Comprehension Verbal cues required;Returned demonstration;Verbalized understanding          OT Short Term Goals - 07/03/15 1113    OT SHORT TERM GOAL #1   Title AROM of wrist improve to [facility] to return using hand fully in selfcare without increase symptoms    Baseline Wrist AROM increase but still pain with use    Time 3   Period Weeks   Status On-going   OT SHORT TERM GOAL #2   Title Pain on PRHWE improve by at least 15 points    Baseline pain on PRWHE 35/50 at eval - still    Time 3   Period Weeks   Status On-going   OT SHORT TERM GOAL #3   Title Pt to be ind in HEP to increase ROM and decrease pain   Baseline ongoing    Time 2   Period Weeks    Status On-going           OT Long Term Goals - 07/03/15 1114    OT LONG TERM GOAL #1   Title Grip strength in L hand  increase by at least more than 50% compare to the R  to cut food and carry at least 8 lbs    Baseline need to assess again - progressing in putty resistanct   Time 4   Period Weeks   Status On-going   OT LONG TERM GOAL #2   Title Prehension grip improve in L hand by at least 3 lbs to turn knobs    Baseline 3 point grip L 8 , R - increase resistance of putty    Time 4   Period Weeks   Status On-going               Plan - 07/03/15 1112    Clinical Impression Statement Pt cont to report pain about 1-2/10 at wrist base of thumb , maybe distal radius pt palpations and ulnar side of wrist - but able to increase reps  and sets this date - cont to increase strength - decrease pain    Pt will benefit from skilled therapeutic intervention in order to improve on the following deficits (Retired) Decreased range of motion;Impaired flexibility;Decreased scar mobility;Impaired UE functional use;Pain;Decreased strength;Increased edema   Rehab Potential Good   OT Frequency 2x / week   OT Duration 4 weeks   OT Treatment/Interventions Self-care/ADL training;Fluidtherapy;Splinting;Patient/family education;Therapeutic exercises;Therapeutic exercise;Scar mobilization;Manual Therapy;Parrafin;Passive range of motion   Plan assess strength , ROM and pain    OT Home Exercise Plan see pt instruction   Consulted and Agree with Plan of Care Patient        Problem List There are no active problems to display for this patient.   Oletta Cohn OTR/L,CLT  07/03/2015, 11:16 AM  [place] [facility] PHYSICAL AND SPORTS MEDICINE [address] [place], [address] Phone: [phone]   Fax:  [phone]  Name: Sandra Cantrell MRN: [id number] Date of Birth: 1966-02-22

## 2015-07-03 NOTE — Patient Instructions (Signed)
2lbs for wrist  - need to increase to 2 sets 12 reps  And green putty for gripping - and pulling - 2 sets now too - 12 reps   And do rubber band for PA and RA of thumb

## 2015-07-11 ENCOUNTER — Ambulatory Visit: Payer: Worker's Compensation | Admitting: Occupational Therapy

## 2015-07-11 ENCOUNTER — Encounter: Payer: Self-pay | Admitting: Occupational Therapy

## 2015-07-11 DIAGNOSIS — M25632 Stiffness of left wrist, not elsewhere classified: Secondary | ICD-10-CM

## 2015-07-11 DIAGNOSIS — M79632 Pain in left forearm: Secondary | ICD-10-CM

## 2015-07-11 DIAGNOSIS — M6281 Muscle weakness (generalized): Secondary | ICD-10-CM

## 2015-07-11 NOTE — Therapy (Signed)
[place] [facility]TS [facility] [address], [place], [address] Phone: [phone]   Fax:  [phone]  Occupational Therapy Treatment  Patient Details  Name: Sandra Cantrell MRN: [id number] Date of Birth: 1965-08-06 Referring Provider: Merlyn Lot  Encounter Date: 07/11/2015      OT End of Session - 07/11/15 1102    OT Start Time 1030   OT Stop Time 1113   OT Time Calculation (min) 43 min   Activity Tolerance Patient tolerated treatment well   Behavior During Therapy [facility] for tasks assessed/performed      Past Medical History  Diagnosis Date  . Post traumatic stress disorder (PTSD)   . Anxiety   . Depression     Past Surgical History  Procedure Laterality Date  . Tonsillectomy    . Dilation and curettage of uterus    . Tubal ligation    . Wrist arthroscopy Left 02/13/2015    Procedure: LEFT ARTHROSCOPY WRIST VOLAR GANGLION EXCISION;  Surgeon: Betha Loa, MD;  Location: [facility];  Service: Orthopedics;  Laterality: Left;  . Ganglion cyst excision Left 02/13/2015    Procedure: REMOVAL GANGLION OF WRIST;  Surgeon: Betha Loa, MD;  Location: [facility];  Service: Orthopedics;  Laterality: Left;    There were no vitals filed for this visit.  Visit Diagnosis:  Stiffness of left wrist joint  Pain of left forearm  Muscle weakness      Subjective Assessment - 07/11/15 1032    Subjective  Pt presents wearing neoprene thumb spica states that she uses it when drving her car, stick shift. She rates he rpain as 1-2/10 base of thumb and wrist.   Patient Stated Goals Want to ride my motorcycle again , I love to fix things but without pain or difficulty, use my hand lke before    Currently in Pain? Yes   Pain Score 2    Pain Location Wrist   Pain Orientation Left   Pain Descriptors / Indicators Aching   Pain Type Surgical pain   Pain Onset More than a month ago                       OT Treatments/Exercises (OP) - 07/11/15 0001    Wrist Exercises   Other wrist exercises 2 lbs for wrist in all planes this date    Other wrist exercises Wrist maze with 1 lbs in all directions  of wrist 5 reps    Hand Exercises   Other Hand Exercises Green putty for gripping and pulling  2 sets of 12  reps    Electrical Stimulation   Electrical Stimulation Location Thumb and  at  radial wrist    Electrical Stimulation Action 2cm patch per protocol for pain, current 6.8   Electrical Stimulation Parameters 10 min   Electrical Stimulation Goals Pain   LUE Fluidotherapy   Number Minutes Fluidotherapy 10 Minutes   LUE Fluidotherapy Location Hand;Wrist   Comments AROM for fingers and wrist in all planes                OT Education - 07/11/15 1044    Education provided Yes   Education Details HEP   Person(s) Educated Patient   Methods Explanation;Demonstration;Tactile cues;Verbal cues   Comprehension Verbalized understanding;Verbal cues required;Returned demonstration          OT Short Term Goals - 07/03/15 1113    OT SHORT TERM GOAL #1  Title [facility] using hand fully in selfcare without increase symptoms    Baseline Wrist AROM increase but still pain with use    Time 3   Period Weeks   Status On-going   OT SHORT TERM GOAL #2   Title Pain on PRHWE improve by at least 15 points    Baseline pain on PRWHE 35/50 at eval - still    Time 3   Period Weeks   Status On-going   OT SHORT TERM GOAL #3   Title Pt to be ind in HEP to increase ROM and decrease pain   Baseline ongoing    Time 2   Period Weeks   Status On-going           OT Long Term Goals - 07/03/15 1114    OT LONG TERM GOAL #1   Title Grip strength in L hand  increase by at least more than 50% compare to the R  to cut food and carry at least 8 lbs    Baseline need to assess again - progressing in putty resistanct   Time 4   Period Weeks    Status On-going   OT LONG TERM GOAL #2   Title Prehension grip improve in L hand by at least 3 lbs to turn knobs    Baseline 3 point grip L 8 , R - increase resistance of putty    Time 4   Period Weeks   Status On-going               Plan - 07/11/15 1053    Clinical Impression Statement Pt contiunes to report pain left radial wrist/thumb about 1-2/10. Continue to focus on increasing strangth left hand.   Pt will benefit from skilled therapeutic intervention in order to improve on the following deficits (Retired) Decreased range of motion;Impaired flexibility;Decreased scar mobility;Impaired UE functional use;Pain;Decreased strength;Increased edema   Rehab Potential Good   Clinical Impairments Affecting Rehab Potential Been about year ago injury and out of work    OT Frequency 2x / week   OT Duration 4 weeks   OT Treatment/Interventions Self-care/ADL training;Fluidtherapy;Splinting;Patient/family education;Therapeutic exercises;Therapeutic exercise;Scar mobilization;Manual Therapy;Parrafin;Passive range of motion   Plan Assess pain, strength and ROM next visit.   OT Home Exercise Plan see pt instruction   Consulted and Agree with Plan of Care Patient        Problem List There are no active problems to display for this patient.   Charletta CousinBarnhill, Amy Beth Dixon, OTR/L 07/11/2015, 11:17 AM  [place] [facility]IC[facility] [address] [place], [place], [address] Phone: [phone]   Fax:  [phone]  Name: Sandra Cantrell MRN: [id number] Date of Birth: 06/10/1965

## 2015-07-11 NOTE — Patient Instructions (Signed)
Pt was educated to cont HEP and AROM. Wean from splint as able.

## 2015-07-15 ENCOUNTER — Ambulatory Visit: Payer: Worker's Compensation | Admitting: Occupational Therapy

## 2015-07-17 ENCOUNTER — Encounter: Payer: Self-pay | Admitting: Occupational Therapy

## 2015-07-17 ENCOUNTER — Ambulatory Visit: Payer: Worker's Compensation | Admitting: Occupational Therapy

## 2015-07-17 DIAGNOSIS — M6281 Muscle weakness (generalized): Secondary | ICD-10-CM

## 2015-07-17 DIAGNOSIS — M25632 Stiffness of left wrist, not elsewhere classified: Secondary | ICD-10-CM | POA: Diagnosis not present

## 2015-07-17 DIAGNOSIS — M79632 Pain in left forearm: Secondary | ICD-10-CM

## 2015-07-17 NOTE — Patient Instructions (Signed)
Pt was educated in joint protection techniques and handout issued. Reviewed HEP and splint use left hand.

## 2015-07-17 NOTE — Therapy (Signed)
[facility] [address] [place], [place], [address] Phone: [phone]   Fax:  [phone]  Occupational Therapy Treatment  Patient Details  Name: Sandra BachelorLinda K Otting MRN: [id number] Date of Birth: Oct 01, 1965 Referring Provider: Merlyn LotKuzma  Encounter Date: 07/17/2015      OT End of Session - 07/17/15 1148    OT Start Time 1114   OT Stop Time 1157   OT Time Calculation (min) 43 min      Past Medical History  Diagnosis Date  . Post traumatic stress disorder (PTSD)   . Anxiety   . Depression     Past Surgical History  Procedure Laterality Date  . Tonsillectomy    . Dilation and curettage of uterus    . Tubal ligation    . Wrist arthroscopy Left 02/13/2015    Procedure: LEFT ARTHROSCOPY WRIST VOLAR GANGLION EXCISION;  Surgeon: Betha LoaKevin Kuzma, MD;  Location: [facility];  Service: Orthopedics;  Laterality: Left;  . Ganglion cyst excision Left 02/13/2015    Procedure: REMOVAL GANGLION OF WRIST;  Surgeon: Betha LoaKevin Kuzma, MD;  Location: [facility];  Service: Orthopedics;  Laterality: Left;    There were no vitals filed for this visit.  Visit Diagnosis:  Stiffness of left wrist joint  Pain of left forearm  Muscle weakness      Subjective Assessment - 07/17/15 1117    Subjective  Pt presents wearing neopreen thumb spica. She states that she "Had been using it and caught myself" left hand Tuesday when trying to get out of the tub. "It wasn't a fall, just pressure when I braced myself."   Patient Stated Goals Want to ride my motorcycle again , I love to fix things but without pain or difficulty, use my hand lke before    Currently in Pain? Yes   Pain Score 4    Pain Location Wrist   Pain Orientation Left   Pain Descriptors / Indicators Aching   Pain Type Chronic pain   Pain Onset More than a month ago        Pt Instruction: Pt was educated in joint protection techniques and handout  issued. Reviewed HEP and splint use left hand.              OT Treatments/Exercises (OP) - 07/17/15 0001    ADLs   ADL Comments Joint protection techniques reviewed nd handout issued.   ADL Education Given Yes   Wrist Exercises   Other wrist exercises 2 lbs for wrist in all planes this date    Other wrist exercises Wrist maze with 1 lbs in all directions  of wrist 5 reps    Hand Exercises   Other Hand Exercises Green putty for gripping and pulling  2 sets of 12  reps    Other Hand Exercises Teal putty for RA , rubber band for PA - also did to thumb in all planes at start and end isometric strength    Electrical Stimulation   Electrical Stimulation Location Thumb and  at  radial wrist    Electrical Stimulation Action 2cm patch per protocol for pain, current 6.8   Electrical Stimulation Parameters 10 min   Electrical Stimulation Goals Pain   LUE Fluidotherapy   Number Minutes Fluidotherapy 10 Minutes   LUE Fluidotherapy Location Hand;Wrist   Comments AROM for fingers and wrist in all planes to assist with decreasing pain, joint stiffness.  OT Education - 07/17/15 1144    Education provided Yes   Education Details Pt was educated in Government social research officer and handout issued. Reviewed HEP and splint use left hand.   Person(s) Educated Patient   Methods Explanation;Demonstration;Verbal cues;Handout   Comprehension Verbalized understanding;Returned demonstration;Verbal cues required          OT Short Term Goals - 07/03/15 1113    OT SHORT TERM GOAL #1   Title AROM of wrist improve to [facility] to return using hand fully in selfcare without increase symptoms    Baseline Wrist AROM increase but still pain with use    Time 3   Period Weeks   Status On-going   OT SHORT TERM GOAL #2   Title Pain on PRHWE improve by at least 15 points    Baseline pain on PRWHE 35/50 at eval - still    Time 3   Period Weeks   Status On-going   OT SHORT TERM GOAL #3    Title Pt to be ind in HEP to increase ROM and decrease pain   Baseline ongoing    Time 2   Period Weeks   Status On-going           OT Long Term Goals - 07/03/15 1114    OT LONG TERM GOAL #1   Title Grip strength in L hand  increase by at least more than 50% compare to the R  to cut food and carry at least 8 lbs    Baseline need to assess again - progressing in putty resistanct   Time 4   Period Weeks   Status On-going   OT LONG TERM GOAL #2   Title Prehension grip improve in L hand by at least 3 lbs to turn knobs    Baseline 3 point grip L 8 , R - increase resistance of putty    Time 4   Period Weeks   Status On-going               Plan - 07/17/15 1145    Clinical Impression Statement Pt continues to report pain fluctuating from 1-4/10. Continue to ficus on increasing strength left hand. Issued handout on joint protection techniques for pt to review and implement during daily activities.   Pt will benefit from skilled therapeutic intervention in order to improve on the following deficits (Retired) Decreased range of motion;Impaired flexibility;Decreased scar mobility;Impaired UE functional use;Pain;Decreased strength;Increased edema   Rehab Potential Good   Clinical Impairments Affecting Rehab Potential Been about year ago injury and out of work    OT Frequency 2x / week   OT Duration 4 weeks   OT Treatment/Interventions Self-care/ADL training;Fluidtherapy;Splinting;Patient/family education;Therapeutic exercises;Therapeutic exercise;Scar mobilization;Manual Therapy;Parrafin;Passive range of motion   Plan Assess strength and ROM next visit.    OT Home Exercise Plan see pt instruction   Consulted and Agree with Plan of Care Patient        Problem List There are no active problems to display for this patient.   Sandra Cantrell, Sandra Cantrell Sandra Cantrell, OTR/L 07/17/2015, 11:59 AM  [place] [facility] PHYSICAL AND SPORTS MEDICINE [address], [place], [address] Phone: [phone]   Fax:  [phone]  Name: Sandra Cantrell MRN: [id number] Date of Birth: 1965/07/07

## 2015-07-21 ENCOUNTER — Ambulatory Visit: Payer: Worker's Compensation | Admitting: Occupational Therapy

## 2015-07-21 DIAGNOSIS — M6281 Muscle weakness (generalized): Secondary | ICD-10-CM

## 2015-07-21 DIAGNOSIS — M25632 Stiffness of left wrist, not elsewhere classified: Secondary | ICD-10-CM | POA: Diagnosis not present

## 2015-07-21 DIAGNOSIS — M79632 Pain in left forearm: Secondary | ICD-10-CM

## 2015-07-21 NOTE — Therapy (Signed)
[place] [facility]TS MEDIC[facility] [address], [place], [address] Phone: [phone]   Fax:  [phone]  Occupational Therapy Treatment  Patient Details  Name: Sandra Cantrell MRN: [id number] Date of Birth: February 25, 1966 Referring Provider: Merlyn Lot  Encounter Date: 07/21/2015      OT End of Session - 07/21/15 1442    Visit Number 10   Number of Visits 12   Date for OT Re-Evaluation 08/18/15   OT Start Time 1044   OT Stop Time 1133   OT Time Calculation (min) 49 min   Activity Tolerance Patient tolerated treatment well   Behavior During Therapy [facility] for tasks assessed/performed      Past Medical History  Diagnosis Date  . Post traumatic stress disorder (PTSD)   . Anxiety   . Depression     Past Surgical History  Procedure Laterality Date  . Tonsillectomy    . Dilation and curettage of uterus    . Tubal ligation    . Wrist arthroscopy Left 02/13/2015    Procedure: LEFT ARTHROSCOPY WRIST VOLAR GANGLION EXCISION;  Surgeon: Betha Loa, MD;  Location: [facility];  Service: Orthopedics;  Laterality: Left;  . Ganglion cyst excision Left 02/13/2015    Procedure: REMOVAL GANGLION OF WRIST;  Surgeon: Betha Loa, MD;  Location: [facility];  Service: Orthopedics;  Laterality: Left;    There were no vitals filed for this visit.  Visit Diagnosis:  Stiffness of left wrist joint - Plan: Ot plan of care cert/re-cert  Pain of left forearm - Plan: Ot plan of care cert/re-cert  Muscle weakness - Plan: Ot plan of care cert/re-cert      Subjective Assessment - 07/21/15 1102    Subjective  I am getting more flexibility - using it more - do not tremor as much when using it - have more strenght - pain not as bad except when cold or when I used it more than I use to    Patient Stated Goals Want to ride my motorcycle again , I love to fix things but without pain or difficulty, use my hand lke before    Currently in Pain? Yes   Pain Score 1    Pain Location Wrist   Pain Orientation Left   Pain Descriptors / Indicators Aching            [facility] OT Assessment - 07/21/15 0001    AROM   Left Forearm Pronation 90 Degrees   Left Forearm Supination 90 Degrees   Left Wrist Extension 50 Degrees   Left Wrist Flexion 50 Degrees   Left Wrist Radial Deviation 20 Degrees   Left Wrist Ulnar Deviation 32 Degrees   Strength   Right Hand Grip (lbs) 75   Right Hand Lateral Pinch 17.5 lbs   Right Hand 3 Point Pinch 18 lbs   Left Hand Grip (lbs) 35   Left Hand Lateral Pinch 15.5 lbs   Left Hand 3 Point Pinch 10 lbs                  OT Treatments/Exercises (OP) - 07/21/15 0001    LUE Fluidotherapy   Number Minutes Fluidotherapy 10 Minutes   LUE Fluidotherapy Location Hand;Wrist   Comments AT SOC to decrease Pain and increase ROM     Pt had appt with Dr Merlyn Lot tomorrow-  Assess ROM at wrist in all planes   Wrist flexion and extnetion limited  Strength pt at 2  lbs weight at the moment  Grip and prehension strength assess  See flowsheet for above   Wrist flexion and exention PROM reviewed again -and pt to address that again  Cont with 2 lbs for wrist flexion and extention - with palm up and down  10 reps each   BTE for lat and 3 point grip this date 100 sec each at 12 lbs  Assess PRWHE for pain improved to 25/50. Function 21/50            OT Education - 07/21/15 1442    Education provided Yes   Education Details HEP to focus on    Person(s) Educated Patient   Methods Explanation;Demonstration;Tactile cues;Verbal cues   Comprehension Verbalized understanding;Returned demonstration          OT Short Term Goals - 07/21/15 1446    OT SHORT TERM GOAL #1   Title [facility] using hand fully in selfcare without increase symptoms    Baseline Still pain with pushing up , wrist flexion and ext still impaired at 50 degrees   Time 2   Period Weeks    Status On-going   OT SHORT TERM GOAL #2   Title Pain on PRHWE improve by at least 15 points    Baseline improved to 25/50   Time 3   Period Weeks   Status On-going   OT SHORT TERM GOAL #3   Title Pt to be ind in HEP to increase ROM and decrease pain   Baseline ongoing    Time 3   Period Weeks   Status On-going           OT Long Term Goals - 07/21/15 1446    OT LONG TERM GOAL #1   Title Grip strength in L hand  increase by at least more than 50% compare to the R  to cut food and carry at least 8 lbs    Baseline improved with 5 lbs - below 50% - still 35 , and R 75   Time 4   Period Weeks   Status On-going   OT LONG TERM GOAL #2   Title Prehension grip improve in L hand by at least 3 lbs to turn knobs    Baseline see flowsheet    Status Achieved               Plan - 07/21/15 1443    Clinical Impression Statement  Pt report pain , wrist mobilitiy and strength improved - pt  report coming in  pain fluctuate about 1-4/10 pain - but with PRWHE pt report she had about 8/1- pain at times -  pt able to tolerate more strengthening  but cont to have pain on radial wrist  and ulnar side of wrist with sensory changes on dorsal hand    Pt will benefit from skilled therapeutic intervention in order to improve on the following deficits (Retired) Decreased range of motion;Impaired flexibility;Decreased scar mobility;Impaired UE functional use;Pain;Decreased strength;Increased edema   Rehab Potential Good   Clinical Impairments Affecting Rehab Potential Been about year ago injury and out of work    OT Frequency 2x / week   OT Duration 4 weeks   OT Treatment/Interventions Self-care/ADL training;Fluidtherapy;Splinting;Patient/family education;Therapeutic exercises;Therapeutic exercise;Scar mobilization;Manual Therapy;Parrafin;Passive range of motion   Plan check how appt went - 1-2 x wk for ROM , strength    OT Home Exercise Plan see pt instruction   Consulted and Agree with Plan of  Care Patient        Problem List There are no active problems to display for this patient.   Oletta Cohn OTR/l,CLT  07/21/2015, 2:51 PM  [place] [facility] PHYSICAL AND SPORTS MEDICINE [address] [place], [address] Phone: [phone]   Fax:  [phone]  Name: Sandra Cantrell MRN: [id number] Date of Birth: 1965/11/10

## 2015-07-21 NOTE — Patient Instructions (Signed)
   Wrist flexion and exention PROM reviewed again -and pt to address that again  Cont with 2 lbs for wrist flexion and extention - with palm up and down  10 reps each

## 2015-07-24 ENCOUNTER — Ambulatory Visit: Payer: Worker's Compensation | Admitting: Occupational Therapy

## 2015-07-24 DIAGNOSIS — M6281 Muscle weakness (generalized): Secondary | ICD-10-CM

## 2015-07-24 DIAGNOSIS — M79632 Pain in left forearm: Secondary | ICD-10-CM

## 2015-07-24 DIAGNOSIS — M25632 Stiffness of left wrist, not elsewhere classified: Secondary | ICD-10-CM | POA: Diagnosis not present

## 2015-07-24 NOTE — Patient Instructions (Addendum)
   Pt to do wrist stretches and stability of wrist on ball 45 sec x 5 at home  Cont with putty

## 2015-07-24 NOTE — Therapy (Signed)
[facility]ORTS MEDICINE [address] [place], [place], [address] Phone: [phone]   Fax:  [phone]  Occupational Therapy Treatment  Patient Details  Name: Sandra Cantrell MRN: [id number] Date of Birth: April 08, 1966 Referring Provider: Merlyn LotKuzma  Encounter Date: 07/24/2015      OT End of Session - 07/24/15 1843    Visit Number 11   Number of Visits 12   Date for OT Re-Evaluation 08/18/15   OT Start Time 1310   OT Stop Time 1400   OT Time Calculation (min) 50 min   Activity Tolerance Patient tolerated treatment well   Behavior [facility] Therapy [facility] for tasks assessed/performed      Past Medical History  Diagnosis Date  . Post traumatic stress disorder (PTSD)   . Anxiety   . Depression     Past Surgical History  Procedure Laterality Date  . Tonsillectomy    . Dilation and curettage of uterus    . Tubal ligation    . Wrist arthroscopy Left 02/13/2015    Procedure: LEFT ARTHROSCOPY WRIST VOLAR GANGLION EXCISION;  Surgeon: Betha LoaKevin Kuzma, MD;  Location: [facility];  Service: Orthopedics;  Laterality: Left;  . Ganglion cyst excision Left 02/13/2015    Procedure: REMOVAL GANGLION OF WRIST;  Surgeon: Betha LoaKevin Kuzma, MD;  Location: [facility];  Service: Orthopedics;  Laterality: Left;    There were no vitals filed for this visit.  Visit Diagnosis:  Stiffness of left wrist joint  Pain of left forearm  Muscle weakness      Subjective Assessment - 07/24/15 1324    Subjective  Seen DR Merlyn LotKuzma - he just know where to poke and have pain  for day - I told you same thing I told you - I have more use and more range - it is getting better slow but certain    Patient Stated Goals Want to ride my motorcycle again , I love to fix things but without pain or difficulty, use my hand lke before    Currently in Pain? Yes   Pain Score 2    Pain Location Wrist   Pain Orientation Left   Pain Descriptors / Indicators  Aching                      OT Treatments/Exercises (OP) - 07/24/15 0001    Electrical Stimulation   Electrical Stimulation Location Thumb and  at  radial wrist    Electrical Stimulation Action 4 x 2 cm  patches    Electrical Stimulation Parameters 10 - at 8 and 6.6 current 2 channels    Electrical Stimulation Goals Pain   LUE Fluidotherapy   Number Minutes Fluidotherapy 12 Minutes   LUE Fluidotherapy Location Hand;Wrist   [facility] to ic[facility] flexion and extentio nof wrist and decrease pain    Manual Therapy   Manual therapy comments Soft tissue mobs with Graston tool using 2,  and 4 on  palmr scar , and then volar and dorsal forearm - sweeping, scooping , fannint and brushing- had large trigger point on radial side of midforearm with tenderness but decrease  afterrwards       Wrist flexion and extnetion stretch 10 x Stability at wrist  on wall into towel - tapping my OT 3 x 45 sec  2 x 45 sec on 1 kg ball wrist stability Teal putty for grip , lat , 3 point grip - 10 reps each  OT Education - 07/24/15 1843    Education provided Yes   Education Details HEP    Person(s) Educated Patient   Methods Explanation;Demonstration;Tactile cues;Verbal cues   Comprehension Verbal cues required;Returned demonstration;Verbalized understanding          OT Short Term Goals - 07/21/15 1446    OT SHORT TERM GOAL #1   Title AROM of wrist improve to [facility] to return using hand fully in selfcare without increase symptoms    Baseline Still pain with pushing up , wrist flexion and ext still impaired at 50 degrees   Time 2   Period Weeks   Status On-going   OT SHORT TERM GOAL #2   Title Pain on PRHWE improve by at least 15 points    Baseline improved to 25/50   Time 3   Period Weeks   Status On-going   OT SHORT TERM GOAL #3   Title Pt to be ind in HEP to increase ROM and decrease pain   Baseline ongoing    Time 3   Period Weeks   Status On-going            OT Long Term Goals - 07/21/15 1446    OT LONG TERM GOAL #1   Title Grip strength in L hand  increase by at least more than 50% compare to the R  to cut food and carry at least 8 lbs    Baseline improved with 5 lbs - below 50% - still 35 , and R 75   Time 4   Period Weeks   Status On-going   OT LONG TERM GOAL #2   Title Prehension grip improve in L hand by at least 3 lbs to turn knobs    Baseline see flowsheet    Status Achieved               Plan - 07/24/15 1844    Clinical Impression Statement Pt new order from MD to cont with ROM , strengthening for L wrist and hand - pt pain about 2-3 /10 during session - add more stability for wrist this date instead for 2 lbss weight -    Pt will benefit from skilled therapeutic intervention in order to improve on the following deficits (Retired) Decreased range of motion;Impaired flexibility;Decreased scar mobility;Impaired UE functional use;Pain;Decreased strength;Increased edema   Rehab Potential Good   Clinical Impairments Affecting Rehab Potential Been about year ago injury and out of work    OT Frequency 2x / week   OT Duration 6 weeks   OT Treatment/Interventions Self-care/ADL training;Fluidtherapy;Splinting;Patient/family education;Therapeutic exercises;Therapeutic exercise;Scar mobilization;Manual Therapy;Parrafin;Passive range of motion   Plan assess progress and HEP    OT Home Exercise Plan see pt instruction   Consulted and Agree with Plan of Care Patient        Problem List There are no active problems to display for this patient.   Oletta Cohn OTR/L,CLT  07/24/2015, 6:47 PM  [facility] PHYSICAL AND SPORTS MEDICINE [address] [place], [address] Phone: [phone]   Fax:  [phone]  Name: Sandra Cantrell MRN: [id number] Date of Birth: 1965/10/29

## 2015-07-29 ENCOUNTER — Ambulatory Visit: Payer: Worker's Compensation | Admitting: Occupational Therapy

## 2015-07-29 DIAGNOSIS — M6281 Muscle weakness (generalized): Secondary | ICD-10-CM

## 2015-07-29 DIAGNOSIS — M79632 Pain in left forearm: Secondary | ICD-10-CM

## 2015-07-29 DIAGNOSIS — M25632 Stiffness of left wrist, not elsewhere classified: Secondary | ICD-10-CM | POA: Diagnosis not present

## 2015-07-29 NOTE — Therapy (Signed)
[facility] [address] [place], [place], [address] Phone: [phone]   Fax:  [phone]  Occupational Therapy Treatment  Patient Details  Name: Sandra Cantrell MRN: [id number] Date of Birth: Oct 01, 1965 Referring Provider: Merlyn LotKuzma  Encounter Date: 07/29/2015      OT End of Session - 07/29/15 1400    OT Start Time 1001   OT Stop Time 1102   OT Time Calculation (min) 61 min   Activity Tolerance Patient tolerated treatment well   Behavior During Therapy [facility] for tasks assessed/performed      Past Medical History  Diagnosis Date  . Post traumatic stress disorder (PTSD)   . Anxiety   . Depression     Past Surgical History  Procedure Laterality Date  . Tonsillectomy    . Dilation and curettage of uterus    . Tubal ligation    . Wrist arthroscopy Left 02/13/2015    Procedure: LEFT ARTHROSCOPY WRIST VOLAR GANGLION EXCISION;  Surgeon: Betha LoaKevin Kuzma, MD;  Location: [facility];  Service: Orthopedics;  Laterality: Left;  . Ganglion cyst excision Left 02/13/2015    Procedure: REMOVAL GANGLION OF WRIST;  Surgeon: Betha LoaKevin Kuzma, MD;  Location: [facility];  Service: Orthopedics;  Laterality: Left;    There were no vitals filed for this visit.  Visit Diagnosis:  Stiffness of left wrist joint  Pain of left forearm  Muscle weakness      Subjective Assessment - 07/29/15 1009    Subjective  I just do not feel good-  and my hand hurts more after last time - I did only do the exercise with the fist only 1 x day becauese it wanted to swell - my wrist is    Patient Stated Goals Want to ride my motorcycle again , I love to fix things but without pain or difficulty, use my hand lke before    Currently in Pain? Yes   Pain Score 3    Pain Location Wrist   Pain Orientation Left   Pain Descriptors / Indicators Aching   Pain Type Chronic pain                      OT  Treatments/Exercises (OP) - 07/29/15 0001    Electrical Stimulation   Electrical Stimulation Location Volar and dorsal wrist    Electrical Stimulation Action 4 x 2 cm pathes   Electrical Stimulation Parameters 8 and 6 current    Electrical Stimulation Goals Pain   LUE Fluidotherapy   Number Minutes Fluidotherapy 10 Minutes   LUE Fluidotherapy Location Hand;Wrist   [facility]  to [facility] pain  at       Wrist flexion and exention PROM   AROM wrist flexion and extention - with palm up and down  10 reps each  1 lbs for UD and RD  Sup/pro 2 lbs  2 sets - 10 reps   BTE for 3 point grip this date 120 sec each at 12 lbs Lat grip 56 sec - at 12 lbs  - 2 x  Stability on wall in pillow - 2 x 1 min            OT Education - 07/29/15 1017    Education provided Yes   Education Details HEP   Person(s) Educated Patient   Methods Explanation;Demonstration;Tactile cues;Verbal cues   Comprehension Returned demonstration;Verbalized understanding          OT  Short Term Goals - 07/21/15 1446    OT SHORT TERM GOAL #1   Title AROM of wrist improve to [facility] to return using hand fully in selfcare without increase symptoms    Baseline Still pain with pushing up , wrist flexion and ext still impaired at 50 degrees   Time 2   Period Weeks   Status On-going   OT SHORT TERM GOAL #2   Title Pain on PRHWE improve by at least 15 points    Baseline improved to 25/50   Time 3   Period Weeks   Status On-going   OT SHORT TERM GOAL #3   Title Pt to be ind in HEP to increase ROM and decrease pain   Baseline ongoing    Time 3   Period Weeks   Status On-going           OT Long Term Goals - 07/21/15 1446    OT LONG TERM GOAL #1   Title Grip strength in L hand  increase by at least more than 50% compare to the R  to cut food and carry at least 8 lbs    Baseline improved with 5 lbs - below 50% - still 35 , and R 75   Time 4   Period Weeks   Status On-going   OT LONG TERM GOAL #2    Title Prehension grip improve in L hand by at least 3 lbs to turn knobs    Baseline see flowsheet    Status Achieved               Plan - 07/29/15 1019    Clinical Impression Statement Pt had some increase pain and edema at wrist compare to last time after doing some stabilitiy exericese- did decrease to end of session   Pt will benefit from skilled therapeutic intervention in order to improve on the following deficits (Retired) Decreased range of motion;Impaired flexibility;Decreased scar mobility;Impaired UE functional use;Pain;Decreased strength;Increased edema   Rehab Potential Good   Clinical Impairments Affecting Rehab Potential Been about year ago injury and out of work    OT Frequency 2x / week   OT Duration 6 weeks   OT Treatment/Interventions Self-care/ADL training;Fluidtherapy;Splinting;Patient/family education;Therapeutic exercises;Therapeutic exercise;Scar mobilization;Manual Therapy;Parrafin;Passive range of motion   Plan upgrade HEP as needed   OT Home Exercise Plan see pt instruction   Consulted and Agree with Plan of Care Patient        Problem List There are no active problems to display for this patient.   Oletta Cohn OTR/L,cLT  07/29/2015, 2:03 PM  [facility] PHYSICAL AND SPORTS MEDICINE [address] [place], [address] Phone: [phone]   Fax:  [phone]  Name: Sandra Cantrell MRN: [id number] Date of Birth: [birth year]

## 2015-07-29 NOTE — Patient Instructions (Addendum)
  Change stability with only holding 3 x 1 min

## 2015-07-31 ENCOUNTER — Ambulatory Visit: Payer: Worker's Compensation | Admitting: Occupational Therapy

## 2015-08-05 ENCOUNTER — Ambulatory Visit: Payer: Worker's Compensation | Attending: Orthopedic Surgery | Admitting: Occupational Therapy

## 2015-08-05 DIAGNOSIS — M6281 Muscle weakness (generalized): Secondary | ICD-10-CM | POA: Diagnosis present

## 2015-08-05 DIAGNOSIS — M79632 Pain in left forearm: Secondary | ICD-10-CM | POA: Diagnosis present

## 2015-08-05 DIAGNOSIS — M25632 Stiffness of left wrist, not elsewhere classified: Secondary | ICD-10-CM | POA: Insufficient documentation

## 2015-08-05 NOTE — Therapy (Signed)
[facility] PH[facility] SPORTS MEDICINE [address] [place], [place], [address] Phone: [phone]   Fax:  [phone]  Occupational Therapy Treatment  Patient Details  Name: Sandra BachelorLinda K Cantrell MRN: [id number] Date of Birth: Aug 08, 1965 Referring Provider: Merlyn LotKuzma  Encounter Date: 08/05/2015      OT End of Session - 08/05/15 1452    Visit Number 13   Number of Visits 16   Date for OT Re-Evaluation 08/19/15   OT Start Time 1346   OT Stop Time 1433   OT Time Calculation (min) 47 min   Activity Tolerance Patient tolerated treatment well   Behavior During Therapy [facility] for tasks assessed/performed      Past Medical History  Diagnosis Date  . Post traumatic stress disorder (PTSD)   . Anxiety   . Depression     Past Surgical History  Procedure Laterality Date  . Tonsillectomy    . Dilation and curettage of uterus    . Tubal ligation    . Wrist arthroscopy Left 02/13/2015    Procedure: LEFT ARTHROSCOPY WRIST VOLAR GANGLION EXCISION;  Surgeon: Betha LoaKevin Kuzma, MD;  Location: [facility];  Service: Orthopedics;  Laterality: Left;  . Ganglion cyst excision Left 02/13/2015    Procedure: REMOVAL GANGLION OF WRIST;  Surgeon: Betha LoaKevin Kuzma, MD;  Location: [facility];  Service: Orthopedics;  Laterality: Left;    There were no vitals filed for this visit.  Visit Diagnosis:  Stiffness of left wrist joint  Pain of left forearm  Muscle weakness      Subjective Assessment - 08/05/15 1449    Subjective  MY wrist just been bothering me since I have seen the Dr that time - pain is about 4 - but having those stabbing pains and shooting at my thumb and on pinkie side of wrist - just from using it in every day things - I am trying to go without my splint    Patient Stated Goals Want to ride my motorcycle again , I love to fix things but without pain or difficulty, use my hand lke before    Currently in Pain? Yes   Pain Score 4    Pain Location Wrist   Pain Orientation Left   Pain Descriptors / Indicators Aching   Pain Type Chronic pain                      OT Treatments/Exercises (OP) - 08/05/15 0001    Ultrasound   Ultrasound Location PRoximal volar forearm  and radial mid forearm    Ultrasound Parameters 3.3MHZ at 50% at 0.8 - for 3 min each   Ultrasound Goals Pain   LUE Fluidotherapy   Number Minutes Fluidotherapy 10 Minutes   LUE Fluidotherapy Location Hand;Wrist   [facility] to [facility] ROM  and decreaes pain    Manual Therapy   Manual therapy comments Soft tissue mobs with Graston tool using 2,  and 4 on  palmr scar , and then volar and dorsal forearm - sweeping, scooping , fannint and brushing- had large trigger point on radial side of midforearm with tenderness but decrease  afterrwards       AAROM for wrist flexion and extention - followed by AROM  AROM UD and RD  10 reps each  Teal putty for grip  , roll , lat grip  10 reps  Stop prior to pain  OT Education - 08/05/15 1452    Education provided Yes   Education Details HEP   Person(s) Educated Patient   Methods Explanation;Demonstration;Tactile cues   Comprehension Verbalized understanding;Returned demonstration          OT Short Term Goals - 07/21/15 1446    OT SHORT TERM GOAL #1   Title AROM of wrist improve to [facility] to return using hand fully in selfcare without increase symptoms    Baseline Still pain with pushing up , wrist flexion and ext still impaired at 50 degrees   Time 2   Period Weeks   Status On-going   OT SHORT TERM GOAL #2   Title Pain on PRHWE improve by at least 15 points    Baseline improved to 25/50   Time 3   Period Weeks   Status On-going   OT SHORT TERM GOAL #3   Title Pt to be ind in HEP to increase ROM and decrease pain   Baseline ongoing    Time 3   Period Weeks   Status On-going           OT Long Term Goals - 07/21/15 1446    OT LONG TERM GOAL #1   Title Grip  strength in L hand  increase by at least more than 50% compare to the R  to cut food and carry at least 8 lbs    Baseline improved with 5 lbs - below 50% - still 35 , and R 75   Time 4   Period Weeks   Status On-going   OT LONG TERM GOAL #2   Title Prehension grip improve in L hand by at least 3 lbs to turn knobs    Baseline see flowsheet    Status Achieved               Plan - 08/05/15 1453    Clinical Impression Statement Pt cont to have pain atwrist and edema - pt responded good on [place] this dae for trigger points proximal and mid radial volar forearm -  wrist extnetion and flexion improve to 60 adn 63 degrees at end of session - pt to hold off on stabiliization and strengrheing to wrist - but cont with putty  - increase wrist flexion and extention -    Pt will benefit from skilled therapeutic intervention in order to improve on the following deficits (Retired) Decreased range of motion;Impaired flexibility;Decreased scar mobility;Impaired UE functional use;Pain;Decreased strength;Increased edema   Rehab Potential Good   OT Frequency 2x / week   OT Duration 2 weeks   OT Treatment/Interventions Self-care/ADL training;Fluidtherapy;Splinting;Patient/family education;Therapeutic exercises;Therapeutic exercise;Scar mobilization;Manual Therapy;Parrafin;Passive range of motion   Plan pt approve for 2 more visits- reeval next session    OT Home Exercise Plan see pt instruction   Consulted and Agree with Plan of Care Patient        Problem List There are no active problems to display for this patient.   Oletta Cohn OTR/l,CLT  08/05/2015, 2:56 PM  [place] [facility] PHYSICAL AND SPORTS MEDICINE [address] [place], [address] Phone: [phone]   Fax:  [phone]  Name: Sandra Cantrell MRN: [id number] Date of Birth: 1966-03-21

## 2015-08-05 NOTE — Patient Instructions (Signed)
Do heat and ice  Some massage  Teal putty for grip , lat and 3 point  10 reps  AAROM for wrist in all planes - focus on Flexion and extention

## 2015-08-12 ENCOUNTER — Encounter: Payer: Self-pay | Admitting: Occupational Therapy

## 2015-08-13 ENCOUNTER — Ambulatory Visit: Payer: Worker's Compensation | Admitting: Occupational Therapy

## 2015-08-13 DIAGNOSIS — M25632 Stiffness of left wrist, not elsewhere classified: Secondary | ICD-10-CM | POA: Diagnosis not present

## 2015-08-13 DIAGNOSIS — M6281 Muscle weakness (generalized): Secondary | ICD-10-CM

## 2015-08-13 DIAGNOSIS — M79632 Pain in left forearm: Secondary | ICD-10-CM

## 2015-08-13 NOTE — Therapy (Signed)
[facility] PHYSICAL AND SPORTS MEDICINE [address] [place], [place], [address] Phone: [phone]   Fax:  [phone]  Occupational Therapy Treatment  Patient Details  Name: Sandra BachelorLinda K Friesen MRN: [id number] Date of Birth: 04/16/1966 Referring Provider: Merlyn LotKuzma  Encounter Date: 08/13/2015      OT End of Session - 08/13/15 1608    Visit Number 14   Number of Visits 20   Date for OT Re-Evaluation 09/02/15   OT Start Time 1351   OT Stop Time 1438   OT Time Calculation (min) 47 min   Activity Tolerance Patient tolerated treatment well   Behavior [facility] Therapy [facility] for tasks assessed/performed      Past Medical History  Diagnosis Date  . Post traumatic stress disorder (PTSD)   . Anxiety   . Depression     Past Surgical History  Procedure Laterality Date  . Tonsillectomy    . Dilation and curettage of uterus    . Tubal ligation    . Wrist arthroscopy Left 02/13/2015    Procedure: LEFT ARTHROSCOPY WRIST VOLAR GANGLION EXCISION;  Surgeon: Betha LoaKevin Kuzma, MD;  Location: [facility];  Service: Orthopedics;  Laterality: Left;  . Ganglion cyst excision Left 02/13/2015    Procedure: REMOVAL GANGLION OF WRIST;  Surgeon: Betha LoaKevin Kuzma, MD;  Location: [facility];  Service: Orthopedics;  Laterality: Left;    There were no vitals filed for this visit.      Subjective Assessment - 08/13/15 1603    Subjective  My hand feels much better - pain about 1/10 - but still frustrating - cannot do what  I use too - and did not had this nerve pain on the side of wrist , and now over the top of my hand when touching there - base of thumb still hurting too - but strenght and ROM is getting better    Patient Stated Goals Want to ride my motorcycle again , I love to fix things but without pain or difficulty, use my hand lke before    Currently in Pain? Yes   Pain Score 1    Pain Location Wrist   Pain Orientation Left   Pain Descriptors  / Indicators Aching            [facility] OT Assessment - 08/13/15 0001    AROM   Left Wrist Extension 56 Degrees   Left Wrist Flexion 70 Degrees   Left Wrist Radial Deviation 22 Degrees   Left Wrist Ulnar Deviation 22 Degrees   Strength   Right Hand Grip (lbs) 75   Right Hand Lateral Pinch 17.5 lbs   Right Hand 3 Point Pinch 18 lbs   Left Hand Grip (lbs) 47   Left Hand Lateral Pinch 14 lbs   Left Hand 3 Point Pinch 12 lbs                  OT Treatments/Exercises (OP) - 08/13/15 0001    Electrical Stimulation   Electrical Stimulation Location Base of thumb and radial wrist    Electrical Stimulation Action 2 x 2 cm patches    Electrical Stimulation Parameters 8.2   Electrical Stimulation Goals Pain   LUE Fluidotherapy   Number Minutes Fluidotherapy 10 Minutes   LUE Fluidotherapy Location Hand;Wrist   [facility] and increase ROM       Measurements taken for ROM , strength as well as PRWHE done  functionally  Score Pain 34/50; function 20.5/50 See flowsheet for ROM , strength   Did this date after pain increased - [place] at 3. , 50% and 0.8 intensity for 4 min over trigger points on forearm -           OT Education - 08/13/15 1608    Education provided Yes   Education Details HEP    Person(s) Educated Patient   Methods Explanation;Demonstration;Tactile cues   Comprehension Returned demonstration;Verbalized understanding          OT Short Term Goals - 08/13/15 1614    OT SHORT TERM GOAL #1   Title AROM of wrist improve to [facility] to return using hand fully in selfcare without increase symptoms    Baseline increase AROM and use - but pain still increase with donning bra, buttons , fix hair, pull up pants , squeeze toothpast - pushing out of tub    Time 3   Period Weeks   Status On-going   OT SHORT TERM GOAL #2   Title Pain on PRHWE improve by at least 15 points    Baseline Pain on PRWHE still 34/50    Time  3   Period Weeks   Status On-going   OT SHORT TERM GOAL #3   Title Pt to be ind in HEP to increase ROM and decrease pain   Baseline ongoing - pain still increase   Time 3   Period Weeks   Status On-going           OT Long Term Goals - 08/13/15 1615    OT LONG TERM GOAL #1   Title Grip strength in L hand  increase by at least more than 50% compare to the R  to cut food and carry at least 8 lbs    Baseline Grip increase to 47 lbs , R 75 - can can carry 8 and 10 lbs about 10 ft - but pain increase to 8/10 - and will drop it    Time 4   Period Weeks   Status On-going   OT LONG TERM GOAL #2   Title Prehension grip improve in L hand by at least 3 lbs to turn knobs    Baseline 3 point increase but lat grp not - increase pain    Time 4   Period Weeks   Status On-going               Plan - 08/13/15 1611    Clinical Impression Statement Reassessment done this date for ROM and grip strength - pt showed great progress in wrist ROM , grip and 3 point grip - lateral grip still cause pain over 1st dorsal compartment and then also she has some nerve issues on ulnar side of wrist and dorsal hand - causing positive Tinel - MD aware of it -  pain still increase with increase use  - but show increase functional use on PRWHE  - would recommend  to follow pt  2 x wk until pt see MD about 2nd May - Md diid wrist order for cont therapy  with her last visit    Rehab Potential Fair   Clinical Impairments Affecting Rehab Potential Been about year ago injury and out of work    OT Frequency 2x / week   OT Duration 4 weeks   OT Treatment/Interventions Self-care/ADL training;Fluidtherapy;Splinting;Patient/family education;Therapeutic exercises;Therapeutic exercise;Scar mobilization;Manual Therapy;Parrafin;Passive range of motion   Plan cont strenghteing and increase use without increase pain  OT Home Exercise Plan see pt instruction   Consulted and Agree with Plan of Care Patient      Patient  will benefit from skilled therapeutic intervention in order to improve the following deficits and impairments:  Decreased range of motion, Impaired flexibility, Decreased scar mobility, Impaired UE functional use, Pain, Decreased strength, Increased edema  Visit Diagnosis: Stiffness of left wrist joint  Pain of left forearm  Muscle weakness    Problem List There are no active problems to display for this patient.   Oletta Cohn OTR/L,CLT  08/13/2015, 4:19 PM  [place] PHYSICAL AND SPORTS MEDICINE [address], [place], [address] Phone: [phone]   Fax:  [phone]  Name: JONI COLEGROVE MRN: [id number] Date of Birth: [birth year]

## 2015-08-14 ENCOUNTER — Encounter: Payer: Worker's Compensation | Admitting: Occupational Therapy

## 2015-08-15 ENCOUNTER — Ambulatory Visit: Payer: Worker's Compensation | Admitting: Occupational Therapy

## 2015-08-15 DIAGNOSIS — M25632 Stiffness of left wrist, not elsewhere classified: Secondary | ICD-10-CM

## 2015-08-15 DIAGNOSIS — M6281 Muscle weakness (generalized): Secondary | ICD-10-CM

## 2015-08-15 DIAGNOSIS — M79632 Pain in left forearm: Secondary | ICD-10-CM

## 2015-08-15 NOTE — Patient Instructions (Signed)
HEP updated  Wrist 2 lbs weight for RD and UD , flexion , ext cont  10 reps  Pronation increase pain - pt to support on leg  To get slight pull  But less pain , off leg for supination - 10 reps   upgrade to green putty resistance - for grip - not increase pain  Hold off on pinch and lat grip - because of trigger point volar forearm - with increase pain   Isometric for Thumb PA and RA in adduction position  10 reps  And add to HEP

## 2015-08-15 NOTE — Therapy (Signed)
[facility] PH[facility]IC[facility] [address] [place], [place], [address] Phone: [phone]   Fax:  [phone]  Occupational Therapy Treatment  Patient Details  Name: Sandra BachelorLinda K Louvier MRN: [id number] Date of Birth: [birth year] Referring Provider: Merlyn LotKuzma  Encounter Date: 08/15/2015      OT End of Session - 08/15/15 1223    Visit Number 15   Number of Visits 20   Date for OT Re-Evaluation 09/02/15   OT Start Time 1117   OT Stop Time 1200   OT Time Calculation (min) 43 min   Activity Tolerance Patient tolerated treatment well   Behavior During Therapy [facility] for tasks assessed/performed      Past Medical History  Diagnosis Date  . Post traumatic stress disorder (PTSD)   . Anxiety   . Depression     Past Surgical History  Procedure Laterality Date  . Tonsillectomy    . Dilation and curettage of uterus    . Tubal ligation    . Wrist arthroscopy Left 02/13/2015    Procedure: LEFT ARTHROSCOPY WRIST VOLAR GANGLION EXCISION;  Surgeon: Betha LoaKevin Kuzma, MD;  Location: [facility];  Service: Orthopedics;  Laterality: Left;  . Ganglion cyst excision Left 02/13/2015    Procedure: REMOVAL GANGLION OF WRIST;  Surgeon: Betha LoaKevin Kuzma, MD;  Location: [facility];  Service: Orthopedics;  Laterality: Left;    There were no vitals filed for this visit.      Subjective Assessment - 08/15/15 1216    Subjective  Still good - trying to use it more - but the hard thing is I  have pain, when using it - and the pain different than prior to surgery - nerve pain over side of wrist to top of hand    Patient Stated Goals Want to ride my motorcycle again , I love to fix things but without pain or difficulty, use my hand lke before    Currently in Pain? Yes   Pain Score 1    Pain Location Wrist   Pain Orientation Left   Pain Descriptors / Indicators Aching                      OT Treatments/Exercises (OP) - 08/15/15  0001    Ultrasound   Ultrasound Location Trigger point over volar forearm    Ultrasound Parameters 3.3mHZ at 20% 4 min 1.0 intensity static atend of session    Ultrasound Goals Pain   LUE Paraffin   Number Minutes Paraffin 10 Minutes   LUE Paraffin Location Hand;Wrist   [facility] heat to decrease pain  with heatingpad around wrist       Wrist 2 lbs weight for RD and UD , flexion , ext  10 reps no pain increase  Pronation increase pain - pt to support on leg  To get slight pull  But less pain , off leg for supination - 10 reps   Add to EHP  Increase to green putty resistance - for grip - not increase pain  Hold off on pinch and lat grip - because of trigger point volar forearm - with increase pain   Isometric for Thumb PA and RA in adduction position  10 reps  And add to HEP              OT Education - 08/15/15 1223    Education provided Yes   Education Details upgraded HEP  Person(s) Educated Patient   Methods Explanation;Demonstration;Tactile cues   Comprehension Returned demonstration;Verbalized understanding          OT Short Term Goals - 08/13/15 1614    OT SHORT TERM GOAL #1   Title AROM of wrist improve to [facility] to return using hand fully in selfcare without increase symptoms    Baseline increase AROM and use - but pain still increase with donning bra, buttons , fix hair, pull up pants , squeeze toothpast - pushing out of tub    Time 3   Period Weeks   Status On-going   OT SHORT TERM GOAL #2   Title Pain on PRHWE improve by at least 15 points    Baseline Pain on PRWHE still 34/50    Time 3   Period Weeks   Status On-going   OT SHORT TERM GOAL #3   Title Pt to be ind in HEP to increase ROM and decrease pain   Baseline ongoing - pain still increase   Time 3   Period Weeks   Status On-going           OT Long Term Goals - 08/13/15 1615    OT LONG TERM GOAL #1   Title Grip strength in L hand  increase by at least more than 50%  compare to the R  to cut food and carry at least 8 lbs    Baseline Grip increase to 47 lbs , R 75 - can can carry 8 and 10 lbs about 10 ft - but pain increase to 8/10 - and will drop it    Time 4   Period Weeks   Status On-going   OT LONG TERM GOAL #2   Title Prehension grip improve in L hand by at least 3 lbs to turn knobs    Baseline 3 point increase but lat grp not - increase pain    Time 4   Period Weeks   Status On-going               Plan - 08/15/15 1224    Clinical Impression Statement Pt show the last 2 visits decrease pain at rest - but still increase on ulnar side ofwrist with nerve pain burning over dorsal hand - and base of thumb - pt did flexion stretch with thumb into fist - and change HEP for 2 lbs and isometric  - upgrade putty resistance - ask for 4  visit until appt with MD on    Rehab Potential Fair   OT Frequency 2x / week   OT Duration 2 weeks   OT Treatment/Interventions Self-care/ADL training;Fluidtherapy;Splinting;Patient/family education;Therapeutic exercises;Therapeutic exercise;Scar mobilization;Manual Therapy;Parrafin;Passive range of motion   Plan assess progress , pain and tolerate HEP    OT Home Exercise Plan see pt instruction   Consulted and Agree with Plan of Care Patient      Patient will benefit from skilled therapeutic intervention in order to improve the following deficits and impairments:  Decreased range of motion, Impaired flexibility, Decreased scar mobility, Impaired UE functional use, Pain, Decreased strength, Increased edema  Visit Diagnosis: Stiffness of left wrist joint  Pain of left forearm  Muscle weakness    Problem List There are no active problems to display for this patient.   Oletta Cohn OTR/L,CLT  08/15/2015, 12:27 PM  [facility] PHYSICAL AND SPORTS MEDICINE [address] [place], [address] Phone: [phone]   Fax:  [phone]  Name: Tanyiah Laurich  Kosinski MRN: [id number] Date of Birth: 08-07-1965

## 2015-08-20 ENCOUNTER — Ambulatory Visit: Payer: Worker's Compensation | Attending: Orthopedic Surgery | Admitting: Occupational Therapy

## 2015-08-20 DIAGNOSIS — M79632 Pain in left forearm: Secondary | ICD-10-CM | POA: Diagnosis present

## 2015-08-20 DIAGNOSIS — M25632 Stiffness of left wrist, not elsewhere classified: Secondary | ICD-10-CM | POA: Diagnosis not present

## 2015-08-20 DIAGNOSIS — M6281 Muscle weakness (generalized): Secondary | ICD-10-CM | POA: Insufficient documentation

## 2015-08-20 NOTE — Therapy (Signed)
[facility] PHYSICAL [facility] SPORTS MEDICINE [address] [place], [place], [address] Phone: [phone]   Fax:  [phone]  Occupational Therapy Treatment  Patient Details  Name: Sandra BachelorLinda K Cantrell MRN: [id number] Date of Birth: [birth year] Referring Provider: Merlyn LotKuzma  Encounter Date: 08/20/2015      OT End of Session - 08/20/15 1443    Visit Number 16   Number of Visits 20   Date for OT Re-Evaluation 09/02/15   OT Start Time 1001   OT Stop Time 1058   OT Time Calculation (min) 57 min   Activity Tolerance Patient tolerated treatment well   Behavior During Therapy [facility] for tasks assessed/performed      Past Medical History  Diagnosis Date  . Post traumatic stress disorder (PTSD)   . Anxiety   . Depression     Past Surgical History  Procedure Laterality Date  . Tonsillectomy    . Dilation and curettage of uterus    . Tubal ligation    . Wrist arthroscopy Left 02/13/2015    Procedure: LEFT ARTHROSCOPY WRIST VOLAR GANGLION EXCISION;  Surgeon: Betha LoaKevin Kuzma, MD;  Location: [facility];  Service: Orthopedics;  Laterality: Left;  . Ganglion cyst excision Left 02/13/2015    Procedure: REMOVAL GANGLION OF WRIST;  Surgeon: Betha LoaKevin Kuzma, MD;  Location: [facility];  Service: Orthopedics;  Laterality: Left;    There were no vitals filed for this visit.      Subjective Assessment - 08/20/15 1004    Subjective  Exercises did okay - using it in the usual tasks- cannot remember that anything bothered me more    Patient Stated Goals Want to ride my motorcycle again , I love to fix things but without pain or difficulty, use my hand lke before    Currently in Pain? Yes   Pain Score 3    Pain Location Wrist   Pain Orientation Left   Pain Descriptors / Indicators Burning;Shooting   Pain Type Chronic pain                      OT Treatments/Exercises (OP) - 08/20/15 0001    Ultrasound   Ultrasound Location  trigger point over volar forearm , and dorsal mid forearm    Ultrasound Parameters 5 min US at3.3MHZ ,  20% 0.8 intensity     Ultrasound Goals Pain   LUE Paraffin   Number Minutes Paraffin 10 Minutes   LUE Paraffin Location Hand;Wrist   [facility] pain - and increase ROM    Manual Therapy   Manual therapy comments Soft tissue mobs with Graston tool using 2,  and 4 on  palmr scar , and then volar and dorsal forearm - sweeping, scooping , fannint and brushing- had large trigger point on radial side of midforearm with tenderness but decrease  afterrwards - and dorsal mid forearm                 OT Education - 08/20/15 1443    Education provided Yes   Education Details HEP   Person(s) Educated Patient   Methods Explanation;Demonstration;Tactile cues;Verbal cues   Comprehension Verbal cues required;Returned demonstration;Verbalized understanding          OT Short Term Goals - 08/13/15 1614    OT SHORT TERM GOAL #1   Title [facility] to return using hand fully in selfcare without increase symptoms    Baseline increase  AROM and use - but pain still increase with donning bra, buttons , fix hair, pull up pants , squeeze toothpast - pushing out of tub    Time 3   Period Weeks   Status On-going   OT SHORT TERM GOAL #2   Title Pain on PRHWE improve by at least 15 points    Baseline Pain on PRWHE still 34/50    Time 3   Period Weeks   Status On-going   OT SHORT TERM GOAL #3   Title Pt to be ind in HEP to increase ROM and decrease pain   Baseline ongoing - pain still increase   Time 3   Period Weeks   Status On-going           OT Long Term Goals - 08/13/15 1615    OT LONG TERM GOAL #1   Title Grip strength in L hand  increase by at least more than 50% compare to the R  to cut food and carry at least 8 lbs    Baseline Grip increase to 47 lbs , R 75 - can can carry 8 and 10 lbs about 10 ft - but pain increase to 8/10 - and will drop it     Time 4   Period Weeks   Status On-going   OT LONG TERM GOAL #2   Title Prehension grip improve in L hand by at least 3 lbs to turn knobs    Baseline 3 point increase but lat grp not - increase pain    Time 4   Period Weeks   Status On-going               Plan - 08/20/15 1444    Clinical Impression Statement Pt was able to tolerate 2 lbs weight but report some popping at Radial wrist with RD - and then with wrsit extention  using 2 lbs report tingling in 2nd digist and thumb  - pt cont to have triggerpoint on volar, radial mid forearm - and this date over dorsal midf forearm    Rehab Potential Fair   OT Frequency 2x / week   OT Duration 2 weeks   OT Treatment/Interventions Self-care/ADL training;Fluidtherapy;Splinting;Patient/family education;Therapeutic exercises;Therapeutic exercise;Scar mobilization;Manual Therapy;Parrafin;Passive range of motion   Plan assess HEP and pain , pins and needles and popping    OT Home Exercise Plan see pt instruction   Consulted and Agree with Plan of Care Patient      Patient will benefit from skilled therapeutic intervention in order to improve the following deficits and impairments:  Decreased range of motion, Impaired flexibility, Decreased scar mobility, Impaired UE functional use, Pain, Decreased strength, Increased edema  Visit Diagnosis: Stiffness of left wrist joint  Pain of left forearm  Muscle weakness    Problem List There are no active problems to display for this patient.   Oletta Cohn OTR/L,CLT  08/20/2015, 2:47 PM  [facility] PHYSICAL AND SPORTS MEDICINE [address] [place], [address] Phone: [phone]   Fax:  [phone]  Name: Sandra Cantrell MRN: [id number] Date of Birth: 01-01-1966

## 2015-08-20 NOTE — Patient Instructions (Addendum)
Same but add     green putty resistance   pulling with all digits and twisting - BW and FW - needed mod v/c - wanted to do lat grip every time  10 reps

## 2015-08-22 ENCOUNTER — Ambulatory Visit: Payer: Worker's Compensation | Admitting: Occupational Therapy

## 2015-08-22 DIAGNOSIS — M25632 Stiffness of left wrist, not elsewhere classified: Secondary | ICD-10-CM | POA: Diagnosis not present

## 2015-08-22 DIAGNOSIS — M6281 Muscle weakness (generalized): Secondary | ICD-10-CM

## 2015-08-22 DIAGNOSIS — M79632 Pain in left forearm: Secondary | ICD-10-CM

## 2015-08-22 NOTE — Patient Instructions (Addendum)
Wrist 3 lbs weight with strap - and larger in palm  for RD and UD  Supination and pronation but supported on pillow or lap - only slight pull - no pain increase   Place and hold for wrist extention   8 reps each above - pain to not increase more than 1-2/10   Add to to HEP    Cont with green putty resistance - for grip  Hold off on pinch and lat grip - because of trigger point volar forearm - with increase pain   Isometric for Thumb PA and RA in adduction position  10 reps

## 2015-08-22 NOTE — Therapy (Signed)
[facility] PHYS[facility] AND SPORTS MEDICINE [address] [place], [address] Phone: [phone]   Fax:  [phone]  Occupational Therapy Treatment  Patient Details  Name: Sandra Cantrell MRN: [id number] Date of Birth: [birth year] Referring Provider: Merlyn Lot  Encounter Date: 08/22/2015      OT End of Session - 08/22/15 1442    Visit Number 17   Number of Visits 20   Date for OT Re-Evaluation 09/02/15   OT Start Time 1000   OT Stop Time 1055   OT Time Calculation (min) 55 min   Activity Tolerance Patient tolerated treatment well   Behavior During Therapy [facility] for tasks assessed/performed      Past Medical History  Diagnosis Date  . Post traumatic stress disorder (PTSD)   . Anxiety   . Depression     Past Surgical History  Procedure Laterality Date  . Tonsillectomy    . Dilation and curettage of uterus    . Tubal ligation    . Wrist arthroscopy Left 02/13/2015    Procedure: LEFT ARTHROSCOPY WRIST VOLAR GANGLION EXCISION;  Surgeon: Betha Loa, MD;  Location: [facility];  Service: Orthopedics;  Laterality: Left;  . Ganglion cyst excision Left 02/13/2015    Procedure: REMOVAL GANGLION OF WRIST;  Surgeon: Betha Loa, MD;  [facility];  Service: Orthopedics;  Laterality: Left;    There were no vitals filed for this visit.      Subjective Assessment - 08/22/15 1009    Subjective  Since I seen the Dr my pain is not as constant  and maybe intense - still that nerve pain , funny feeling over my back of hand - pain is better in the forearm more at wrist and hand. My twisting  is better too, I think, less pain    Patient Stated Goals Want to ride my motorcycle again , I love to fix things but without pain or difficulty, use my hand lke before    Currently in Pain? Yes   Pain Score 2    Pain Location Wrist   Pain Orientation Left   Pain Descriptors / Indicators Burning;Shooting                       OT Treatments/Exercises (OP) - 08/22/15 0001    Electrical Stimulation   Electrical Stimulation Location Base of thumb and radial wrist - and distal and proximal to radial volar mid forearm trigger point    Electrical Stimulation Action 2 - 2 x 2 cm patches at each spot    Electrical Stimulation Parameters 7.5 current - 10 min  each    Electrical Stimulation Goals Pain   LUE Paraffin   Number Minutes Paraffin 10 Minutes   LUE Paraffin Location Hand;Wrist   Comments At [facility] to decrease pain and icnrease ROM    Manual Therapy   Manual therapy comments Soft tissue mobs with Graston tool using 2,  and 4 on  palmr scar , and then volar and dorsal forearm - sweeping, scooping , fannint and brushing- had large trigger point on radial side of midforearm with tenderness      Wrist 3 lbs weight with strap - and larger in palm  for RD and UD  10 reps no pain increase Supination and pronation but supported on pillow or lap - only slight pull - no pain increase    Place and hold for wrist extention   8 reps  each above - pain to not increase more than 1-2/10   Add to to HEP   green putty resistance - for grip - not increase pain  Hold off on pinch and lat grip - because of trigger point volar forearm - with increase pain   Isometric for Thumb PA and RA in adduction position  10 reps              OT Education - 08/22/15 1442    Education provided Yes   Education Details HEP    Person(s) Educated Patient   Methods Explanation;Demonstration;Tactile cues;Verbal cues;Handout   Comprehension Verbal cues required;Returned demonstration;Verbalized understanding          OT Short Term Goals - 08/13/15 1614    OT SHORT TERM GOAL #1   [facility] using hand fully in selfcare without increase symptoms    Baseline increase AROM and use - but pain still increase with donning bra, buttons , fix hair, pull up pants , squeeze  toothpast - pushing out of tub    Time 3   Period Weeks   Status On-going   OT SHORT TERM GOAL #2   Title Pain on PRHWE improve by at least 15 points    Baseline Pain on PRWHE still 34/50    Time 3   Period Weeks   Status On-going   OT SHORT TERM GOAL #3   Title Pt to be ind in HEP to increase ROM and decrease pain   Baseline ongoing - pain still increase   Time 3   Period Weeks   Status On-going           OT Long Term Goals - 08/13/15 1615    OT LONG TERM GOAL #1   Title Grip strength in L hand  increase by at least more than 50% compare to the R  to cut food and carry at least 8 lbs    Baseline Grip increase to 47 lbs , R 75 - can can carry 8 and 10 lbs about 10 ft - but pain increase to 8/10 - and will drop it    Time 4   Period Weeks   Status On-going   OT LONG TERM GOAL #2   Title Prehension grip improve in L hand by at least 3 lbs to turn knobs    Baseline 3 point increase but lat grp not - increase pain    Time 4   Period Weeks   Status On-going               Plan - 08/22/15 1443    Clinical Impression Statement Pt pain about 1-2/10 coming in - report not as intense or often - but more at wrist and hand now - pt was able to tolerate more weight at wrist but large handle - hold off on putty pulling - increase pain dorsal hand j- did not had numnbess at fingers with 3 lbs lager weight - popping at wrist with RD still -   Neg for CT test( Tinel and phalans)  - but  did had symtoms wiht pressure  on lat eral side of wrist  ,    Rehab Potential Fair   OT Frequency 2x / week   OT Duration 2 weeks   OT Treatment/Interventions Self-care/ADL training;Fluidtherapy;Splinting;Patient/family education;Therapeutic exercises;Therapeutic exercise;Scar mobilization;Manual Therapy;Parrafin;Passive range of motion   Plan Assess pain - how did with EHP upgrade - was improved  OT Home Exercise Plan see pt instruction   Consulted and Agree with Plan of Care Patient       Patient will benefit from skilled therapeutic intervention in order to improve the following deficits and impairments:  Decreased range of motion, Impaired flexibility, Decreased scar mobility, Impaired UE functional use, Pain, Decreased strength, Increased edema  Visit Diagnosis: Stiffness of left wrist joint  Pain of left forearm  Muscle weakness    Problem List There are no active problems to display for this patient.   Oletta Cohn OTR/L,CLT  08/22/2015, 2:46 PM  [facility] PHYSICAL AND SPORTS MEDICINE [address], [place], [address] Phone: [phone]   Fax:  [phone]  Name: Sandra Cantrell MRN: [id number] Date of Birth: 1965-07-28

## 2015-08-26 ENCOUNTER — Ambulatory Visit: Payer: Worker's Compensation | Admitting: Occupational Therapy

## 2015-08-26 DIAGNOSIS — M25632 Stiffness of left wrist, not elsewhere classified: Secondary | ICD-10-CM | POA: Diagnosis not present

## 2015-08-26 DIAGNOSIS — M79632 Pain in left forearm: Secondary | ICD-10-CM

## 2015-08-26 DIAGNOSIS — M6281 Muscle weakness (generalized): Secondary | ICD-10-CM

## 2015-08-26 NOTE — Patient Instructions (Addendum)
Same HEP  - 3 lbs  Keep pain with weight and putty under 3/10

## 2015-08-26 NOTE — Therapy (Signed)
[facility] [facility] PHYSICAL [facility] SPORTS MEDICINE [address], [place], [address] Phone: [phone]   Fax:  [phone]  Occupational Therapy Treatment  Patient Details  Name: Sandra Cantrell MRN: [id number] Date of Birth: 1965-05-12 Referring Provider: Merlyn Lot  Encounter Date: 08/26/2015      OT End of Session - 08/26/15 1235    Visit Number 18   Number of Visits 20   Date for OT Re-Evaluation 09/02/15   OT Start Time 1218   OT Stop Time 1310   OT Time Calculation (min) 52 min   Activity Tolerance Patient tolerated treatment well   Behavior During Therapy [facility] for tasks assessed/performed      Past Medical History  Diagnosis Date  . Post traumatic stress disorder (PTSD)   . Anxiety   . Depression     Past Surgical History  Procedure Laterality Date  . Tonsillectomy    . Dilation and curettage of uterus    . Tubal ligation    . Wrist arthroscopy Left 02/13/2015    Procedure: LEFT ARTHROSCOPY WRIST VOLAR GANGLION EXCISION;  Surgeon: Betha Loa, MD;  Location: [facility];  Service: Orthopedics;  Laterality: Left;  . Ganglion cyst excision Left 02/13/2015    Procedure: REMOVAL GANGLION OF WRIST;  Surgeon: Betha Loa, MD;  Location: [facility];  Service: Orthopedics;  Laterality: Left;    There were no vitals filed for this visit.      Subjective Assessment - 08/26/15 1222    Subjective  Still hurting - did not like the 3 lbs weight to much - index finger stays numb - still some popping at he wrist with sideways movement of wrist - and pain on both sides of my wrist stilll-    Patient Stated Goals Want to ride my motorcycle again , I love to fix things but without pain or difficulty, use my hand lke before    Currently in Pain? Yes   Pain Score 3    Pain Location Wrist   Pain Orientation Left   Pain Descriptors / Indicators Constant                      OT Treatments/Exercises (OP)  - 08/26/15 0001    Electrical Stimulation   Electrical Stimulation Location --   Electrical Stimulation Goals --   LUE Paraffin   Number Minutes Paraffin 10 Minutes   LUE Paraffin Location Hand;Wrist   Comments AT [facility] to decrease pain      SOft tissue mobs with Graston tools 2 and 4 - sweeping , scooping an brushing over radial side of forearm , volar wrist and forearm , dorsal forearm prior to ROM - some trigger points - redness afterwards    Wrist 3 lbs wrist weight around hand sup /pro - pain on ulnar side of hand during supination Wrist weight 3 lbs - RD and UD - 10 reps - increase tingling in 2nd digit  10 reps Wrist extention place and hold 3 lbs wrist weight around hand - 8 reps Pain 3/10 increase from 2/10 - pain on ulnar side of wrist, radial side of forearm and base of thumb -depending on exercise    green putty resistance - for grip - some pain but under 3/10  Isometric for Thumb PA and RA in adduction position  10 reps             OT Education - 08/26/15 1235  Education provided Yes   Education Details HEP   Person(s) Educated Patient   Methods Explanation;Demonstration;Tactile cues;Verbal cues   Comprehension Verbal cues required;Returned demonstration;Verbalized understanding          OT Short Term Goals - 08/13/15 1614    OT SHORT TERM GOAL #1   Title [facility] wrist [facility] to [facility] to return using hand fully in selfcare without increase symptoms    Baseline increase AROM and use - but pain still increase with donning bra, buttons , fix hair, pull up pants , squeeze toothpast - pushing out of tub    Time 3   Period Weeks   Status On-going   OT SHORT TERM GOAL #2   Title Pain on PRHWE improve by at least 15 points    Baseline Pain on PRWHE still 34/50    Time 3   Period Weeks   Status On-going   OT SHORT TERM GOAL #3   Title Pt to be ind in HEP to increase ROM and decrease pain   Baseline ongoing - pain still increase   Time 3   Period Weeks    Status On-going           OT Long Term Goals - 08/13/15 1615    OT LONG TERM GOAL #1   Title Grip strength in L hand  increase by at least more than 50% compare to the R  to cut food and carry at least 8 lbs    Baseline Grip increase to 47 lbs , R 75 - can can carry 8 and 10 lbs about 10 ft - but pain increase to 8/10 - and will drop it    Time 4   Period Weeks   Status On-going   OT LONG TERM GOAL #2   Title Prehension grip improve in L hand by at least 3 lbs to turn knobs    Baseline 3 point increase but lat grp not - increase pain    Time 4   Period Weeks   Status On-going               Plan - 08/26/15 1236    Clinical Impression Statement Pt cont to have pain about 3/10 at wrist during ther ex - did increase to 3 lbs this dae - pt has pain different places depending on exercises - and report some numb /tingling feeling in index - popping at times with RD /UD -   Rehab Potential Fair   OT Frequency 2x / week   OT Duration 2 weeks   OT Treatment/Interventions Self-care/ADL training;Fluidtherapy;Splinting;Patient/family education;Therapeutic exercises;Therapeutic exercise;Scar mobilization;Manual Therapy;Parrafin;Passive range of motion   Plan Reassess and PN for MD  visit   OT Home Exercise Plan see pt instruction   Consulted and Agree with Plan of Care Patient      Patient will benefit from skilled therapeutic intervention in order to improve the following deficits and impairments:  Decreased range of motion, Impaired flexibility, Decreased scar mobility, Impaired UE functional use, Pain, Decreased strength, Increased edema  Visit Diagnosis: Stiffness of left wrist joint  Pain of left forearm  Muscle weakness    Problem List There are no active problems to display for this patient.   Oletta CohnuPreez, Trystin Hargrove OTR/L,CLT  08/26/2015, 1:21 PM  [facility] [facility] [address] [place], [place],  [id number] Phone: [phone]   Fax:  [phone]  Name: Keenan BachelorLinda K Allaire MRN: [id number] Date of Birth: Jan 26, 1966

## 2015-08-28 ENCOUNTER — Ambulatory Visit: Payer: Worker's Compensation | Admitting: Occupational Therapy

## 2015-08-28 DIAGNOSIS — M25632 Stiffness of left wrist, not elsewhere classified: Secondary | ICD-10-CM

## 2015-08-28 DIAGNOSIS — M79632 Pain in left forearm: Secondary | ICD-10-CM

## 2015-08-28 DIAGNOSIS — M6281 Muscle weakness (generalized): Secondary | ICD-10-CM

## 2015-08-28 NOTE — Therapy (Signed)
[place] PHYSICAL AND SPORTS MEDICINE [address], [place], [address] Phone: [phone]   Fax:  [phone]  Occupational Therapy Treatment  Patient Details  Name: Sandra Cantrell MRN: [id number] Date of Birth: 1965-06-17 Referring Provider: Fredna Dow  Encounter Date: 08/28/2015      OT End of Session - 08/28/15 1507    Visit Number 19   Number of Visits 19   Date for OT Re-Evaluation 08/28/15   OT Start Time 1436   OT Stop Time 1515   OT Time Calculation (min) 39 min   Activity Tolerance Patient tolerated treatment well   Behavior During Therapy [facility] for tasks assessed/performed      Past Medical History  Diagnosis Date  . Post traumatic stress disorder (PTSD)   . Anxiety   . Depression     Past Surgical History  Procedure Laterality Date  . Tonsillectomy    . Dilation and curettage of uterus    . Tubal ligation    . Wrist arthroscopy Left 02/13/2015    Procedure: LEFT ARTHROSCOPY WRIST VOLAR GANGLION EXCISION;  Surgeon: Leanora Cover, MD;  Location: [place];  Service: Orthopedics;  Laterality: Left;  . Ganglion cyst excision Left 02/13/2015    Procedure: REMOVAL GANGLION OF WRIST;  Surgeon: Leanora Cover, MD;  Location: [place];  Service: Orthopedics;  Laterality: Left;    There were no vitals filed for this visit.      Subjective Assessment - 08/28/15 1443    Subjective  movement better since seeing MD but still stiff- pain at about the same dependents on what I did that day - always aching - popping I had some after surgery but now little more - thumb and index fingers contstant numb feeling - with pins and needles  if using it - still some nerve pins and needles over back of and and side of wrist into pinkie    Patient Stated Goals Want to ride my motorcycle again , I love to fix things but without pain or difficulty, use my hand lke before    Currently in Pain? Yes   Pain Score 3    Pain Location Wrist   Pain Orientation Left   Pain Descriptors / Indicators Aching   Pain Type Chronic pain            [facility] OT Assessment - 08/28/15 0001    AROM   Left Wrist Extension 48 Degrees   Left Wrist Flexion 70 Degrees   Left Wrist Radial Deviation 15 Degrees   Left Wrist Ulnar Deviation 32 Degrees   Strength   Right Hand Grip (lbs) 86   Right Hand Lateral Pinch 19 lbs   Right Hand 3 Point Pinch 20 lbs   Left Hand Grip (lbs) 58   Left Hand Lateral Pinch 15 lbs   Left Hand 3 Point Pinch 14 lbs            Measurements taken for wrist ROM , grip and prehension strength  See flowsheet   Pain assess and function  Paraffin done to L hand and wrist to decrease pain   Manual therapy done Graston tools - nr  2 and 4 sweeping , brushing and scooping over trigger point on radial side of forearm at end of session                 OT Education - 08/28/15 1507    Education provided Yes   Education  Details HEP to cont with    Person(s) Educated Patient   Methods Explanation;Demonstration;Tactile cues;Verbal cues   Comprehension Returned demonstration;Verbalized understanding          OT Short Term Goals - 08/28/15 1455    OT SHORT TERM GOAL #1   Title AROM of wrist improve to [facility] to return using hand fully in selfcare without increase symptoms    Baseline increase AROM and use - but pain still increase with donning bra, buttons , fix hair, pull up pants , squeeze toothpast - pushing out of tub    Status Partially Met   OT SHORT TERM GOAL #2   Title Pain on PRHWE improve by at least 15 points    Baseline Pain on PRWHE still 34/50    Status Partially Met   OT SHORT TERM GOAL #3   Title Pt to be ind in HEP to increase ROM and decrease pain   Baseline Pain still present with increase weight or use   Status Achieved           OT Long Term Goals - 08/28/15 1457    OT LONG TERM GOAL #1   Title Grip strength in L hand  increase by at least more  than 50% compare to the R  to cut food and carry at least 8 lbs    Baseline Grip 58 lbs L , 86 - pain with carrying 8 lbs - compensate with increase wrist flexoin - 10 ft carry    Status Partially Met   OT LONG TERM GOAL #2   Title Prehension grip improve in L hand by at least 3 lbs to turn knobs    Baseline increase but do sow and compensate with arm    Status Achieved               Plan - 08/28/15 1508    Clinical Impression Statement Pt showed great progress in grip , prehension strength as well as ROM at wrist - but cont to be lmitied by pain , and triggerpoing on volar forearm - and last few wks increase sensory changes in 2dn and thumb digits - and popping at wrist with RD and UD - pt to see MD for further assessment and plan of care   OT Treatment/Interventions Self-care/ADL training;Fluidtherapy;Splinting;Patient/family education;Therapeutic exercises;Therapeutic exercise;Scar mobilization;Manual Therapy;Parrafin;Passive range of motion   Plan pt to see MD - HEP to cont with - possible discharge pt cont to have pain    Consulted and Agree with Plan of Care Patient      Patient will benefit from skilled therapeutic intervention in order to improve the following deficits and impairments:     Visit Diagnosis: Stiffness of left wrist joint  Pain of left forearm  Muscle weakness    Problem List There are no active problems to display for this patient.   Rosalyn Gess OTR/L,CLT 08/28/2015, 3:54 PM  [facility] PHYSICAL AND SPORTS MEDICINE [address] [place], [address] Phone: [phone]   Fax:  [phone]  Name: Sandra Cantrell MRN: [id number] Date of Birth: 21-May-1965

## 2015-09-04 ENCOUNTER — Emergency Department: Payer: Self-pay

## 2015-09-04 ENCOUNTER — Encounter: Payer: Self-pay | Admitting: Radiology

## 2015-09-04 ENCOUNTER — Inpatient Hospital Stay
Admission: EM | Admit: 2015-09-04 | Discharge: 2015-09-06 | DRG: 378 | Disposition: A | Discharge status: Home / Self Care | Payer: Self-pay | Attending: Specialist | Admitting: Specialist

## 2015-09-04 DIAGNOSIS — G43909 Migraine, unspecified, not intractable, without status migrainosus: Secondary | ICD-10-CM | POA: Diagnosis present

## 2015-09-04 DIAGNOSIS — Z79899 Other long term (current) drug therapy: Secondary | ICD-10-CM

## 2015-09-04 DIAGNOSIS — F17210 Nicotine dependence, cigarettes, uncomplicated: Secondary | ICD-10-CM | POA: Diagnosis present

## 2015-09-04 DIAGNOSIS — I248 Other forms of acute ischemic heart disease: Secondary | ICD-10-CM | POA: Diagnosis present

## 2015-09-04 DIAGNOSIS — K254 Chronic or unspecified gastric ulcer with hemorrhage: Principal | ICD-10-CM | POA: Diagnosis present

## 2015-09-04 DIAGNOSIS — Z803 Family history of malignant neoplasm of breast: Secondary | ICD-10-CM

## 2015-09-04 DIAGNOSIS — R778 Other specified abnormalities of plasma proteins: Secondary | ICD-10-CM

## 2015-09-04 DIAGNOSIS — Z8249 Family history of ischemic heart disease and other diseases of the circulatory system: Secondary | ICD-10-CM

## 2015-09-04 DIAGNOSIS — K2971 Gastritis, unspecified, with bleeding: Secondary | ICD-10-CM | POA: Diagnosis present

## 2015-09-04 DIAGNOSIS — K210 Gastro-esophageal reflux disease with esophagitis: Secondary | ICD-10-CM | POA: Diagnosis present

## 2015-09-04 DIAGNOSIS — D72829 Elevated white blood cell count, unspecified: Secondary | ICD-10-CM | POA: Diagnosis present

## 2015-09-04 DIAGNOSIS — Z833 Family history of diabetes mellitus: Secondary | ICD-10-CM

## 2015-09-04 DIAGNOSIS — K219 Gastro-esophageal reflux disease without esophagitis: Secondary | ICD-10-CM

## 2015-09-04 DIAGNOSIS — J302 Other seasonal allergic rhinitis: Secondary | ICD-10-CM | POA: Diagnosis present

## 2015-09-04 DIAGNOSIS — G4700 Insomnia, unspecified: Secondary | ICD-10-CM | POA: Diagnosis present

## 2015-09-04 DIAGNOSIS — F4310 Post-traumatic stress disorder, unspecified: Secondary | ICD-10-CM

## 2015-09-04 DIAGNOSIS — F329 Major depressive disorder, single episode, unspecified: Secondary | ICD-10-CM | POA: Diagnosis present

## 2015-09-04 DIAGNOSIS — Z880 Allergy status to penicillin: Secondary | ICD-10-CM

## 2015-09-04 DIAGNOSIS — F419 Anxiety disorder, unspecified: Secondary | ICD-10-CM | POA: Diagnosis present

## 2015-09-04 DIAGNOSIS — R109 Unspecified abdominal pain: Secondary | ICD-10-CM | POA: Diagnosis present

## 2015-09-04 DIAGNOSIS — R7989 Other specified abnormal findings of blood chemistry: Secondary | ICD-10-CM

## 2015-09-04 DIAGNOSIS — R112 Nausea with vomiting, unspecified: Secondary | ICD-10-CM

## 2015-09-04 DIAGNOSIS — R319 Hematuria, unspecified: Secondary | ICD-10-CM | POA: Diagnosis present

## 2015-09-04 HISTORY — DX: Pain disorder exclusively related to psychological factors: F45.41

## 2015-09-04 HISTORY — DX: Allergy status to unspecified drugs, medicaments and biological substances: Z88.9

## 2015-09-04 LAB — URINALYSIS COMPLETE WITH MICROSCOPIC (ARMC ONLY)
BILIRUBIN URINE: NEGATIVE
Bacteria, UA: NONE SEEN
Glucose, UA: NEGATIVE mg/dL
KETONES UR: NEGATIVE mg/dL
LEUKOCYTES UA: NEGATIVE
NITRITE: NEGATIVE
PH: 6 (ref 5.0–8.0)
Protein, ur: 30 mg/dL — AB
SPECIFIC GRAVITY, URINE: 1.03 (ref 1.005–1.030)

## 2015-09-04 LAB — CBC WITH DIFFERENTIAL/PLATELET
Basophils Absolute: 0.1 10*3/uL (ref 0–0.1)
Eosinophils Absolute: 0 10*3/uL (ref 0–0.7)
Eosinophils Relative: 0 %
HEMATOCRIT: 38.4 % (ref 35.0–47.0)
Hemoglobin: 13.1 g/dL (ref 12.0–16.0)
Lymphs Abs: 1.8 10*3/uL (ref 1.0–3.6)
MCH: 31.1 pg (ref 26.0–34.0)
MCHC: 34 g/dL (ref 32.0–36.0)
MCV: 91.4 fL (ref 80.0–100.0)
MONO ABS: 1.4 10*3/uL — AB (ref 0.2–0.9)
NEUTROS ABS: 15.6 10*3/uL — AB (ref 1.4–6.5)
Neutrophils Relative %: 83 %
Platelets: 263 10*3/uL (ref 150–440)
RBC: 4.2 MIL/uL (ref 3.80–5.20)
RDW: 13.6 % (ref 11.5–14.5)
WBC: 18.8 10*3/uL — ABNORMAL HIGH (ref 3.6–11.0)

## 2015-09-04 LAB — CBC
HCT: 42.6 % (ref 35.0–47.0)
Hemoglobin: 14.7 g/dL (ref 12.0–16.0)
MCH: 31.3 pg (ref 26.0–34.0)
MCHC: 34.5 g/dL (ref 32.0–36.0)
MCV: 90.6 fL (ref 80.0–100.0)
PLATELETS: 319 10*3/uL (ref 150–440)
RBC: 4.7 MIL/uL (ref 3.80–5.20)
RDW: 13.8 % (ref 11.5–14.5)
WBC: 21.5 10*3/uL — ABNORMAL HIGH (ref 3.6–11.0)

## 2015-09-04 LAB — COMPREHENSIVE METABOLIC PANEL
ALBUMIN: 4.5 g/dL (ref 3.5–5.0)
ALK PHOS: 73 U/L (ref 38–126)
ALT: 32 U/L (ref 14–54)
ANION GAP: 12 (ref 5–15)
AST: 24 U/L (ref 15–41)
BUN: 22 mg/dL — ABNORMAL HIGH (ref 6–20)
CALCIUM: 9.9 mg/dL (ref 8.9–10.3)
CO2: 25 mmol/L (ref 22–32)
CREATININE: 1.08 mg/dL — AB (ref 0.44–1.00)
Chloride: 99 mmol/L — ABNORMAL LOW (ref 101–111)
GFR calc Af Amer: >60 mL/min (ref >60)
GFR calc non Af Amer: >60 mL/min (ref >60)
GLUCOSE: 125 mg/dL — AB (ref 65–99)
Potassium: 3.8 mmol/L (ref 3.5–5.1)
SODIUM: 136 mmol/L (ref 135–145)
TOTAL PROTEIN: 8 g/dL (ref 6.5–8.1)
Total Bilirubin: 0.4 mg/dL (ref 0.3–1.2)

## 2015-09-04 LAB — LIPASE, BLOOD: Lipase: 34 U/L (ref 11–51)

## 2015-09-04 LAB — TROPONIN I
Troponin I: 0.06 ng/mL — ABNORMAL HIGH (ref <0.031)
Troponin I: <0.03 ng/mL (ref <0.031)

## 2015-09-04 LAB — HEMOGLOBIN: Hemoglobin: 13.1 g/dL (ref 12.0–16.0)

## 2015-09-04 MED ORDER — ONDANSETRON HCL 4 MG/2ML IJ SOLN
INTRAMUSCULAR | Status: AC
  Administered 2015-09-04: 4 mg via INTRAVENOUS
  Filled 2015-09-04: qty 2

## 2015-09-04 MED ORDER — BUPROPION HCL ER (XL) 150 MG PO TB24: 150.0000 mg | ORAL_TABLET | Freq: Every day | ORAL | Status: DC

## 2015-09-04 MED ORDER — ENOXAPARIN SODIUM 40 MG/0.4ML ~~LOC~~ SOLN
40.0000 mg | SUBCUTANEOUS | Status: DC
  Filled 2015-09-04: qty 0.4

## 2015-09-04 MED ORDER — ONDANSETRON HCL 4 MG/2ML IJ SOLN
4.0000 mg | Freq: Once | INTRAMUSCULAR | Status: AC
  Administered 2015-09-04: 4 mg via INTRAVENOUS
  Filled 2015-09-04: qty 2

## 2015-09-04 MED ORDER — MORPHINE SULFATE (PF) 4 MG/ML IV SOLN
4.0000 mg | Freq: Once | INTRAVENOUS | Status: AC
  Administered 2015-09-04: 4 mg via INTRAVENOUS

## 2015-09-04 MED ORDER — ONDANSETRON HCL 4 MG/2ML IJ SOLN
4.0000 mg | Freq: Once | INTRAMUSCULAR | Status: AC
  Administered 2015-09-04: 4 mg via INTRAVENOUS

## 2015-09-04 MED ORDER — SODIUM CHLORIDE 0.9 % IV SOLN
INTRAVENOUS | Status: DC
  Administered 2015-09-04 – 2015-09-06 (×4): via INTRAVENOUS

## 2015-09-04 MED ORDER — MORPHINE SULFATE (PF) 4 MG/ML IV SOLN
4.0000 mg | Freq: Once | INTRAVENOUS | Status: AC
  Administered 2015-09-04: 4 mg via INTRAVENOUS
  Filled 2015-09-04: qty 1

## 2015-09-04 MED ORDER — PANTOPRAZOLE SODIUM 40 MG IV SOLR: 40.0000 mg | Freq: Two times a day (BID) | INTRAVENOUS | Status: DC

## 2015-09-04 MED ORDER — ONDANSETRON HCL 4 MG PO TABS
4.0000 mg | ORAL_TABLET | Freq: Four times a day (QID) | ORAL | Status: DC | PRN
Start: 2015-09-04 — End: 2015-09-06

## 2015-09-04 MED ORDER — IOPAMIDOL (ISOVUE-300) INJECTION 61%
100.0000 mL | Freq: Once | INTRAVENOUS | Status: AC | PRN
  Administered 2015-09-04: 100 mL via INTRAVENOUS

## 2015-09-04 MED ORDER — SODIUM CHLORIDE 0.9 % IV SOLN
8.0000 mg/h | INTRAVENOUS | Status: DC
  Administered 2015-09-04 – 2015-09-05 (×3): 8 mg/h via INTRAVENOUS
  Filled 2015-09-04 (×4): qty 80

## 2015-09-04 MED ORDER — HYDROCODONE-ACETAMINOPHEN 5-325 MG PO TABS
1.0000 | ORAL_TABLET | ORAL | Status: DC | PRN
  Administered 2015-09-05: 1 via ORAL
  Filled 2015-09-04: qty 1

## 2015-09-04 MED ORDER — ACETAMINOPHEN 650 MG RE SUPP: 650.0000 mg | Freq: Four times a day (QID) | RECTAL | Status: DC | PRN

## 2015-09-04 MED ORDER — DIATRIZOATE MEGLUMINE & SODIUM 66-10 % PO SOLN
15.0000 mL | Freq: Once | ORAL | Status: AC
Start: 2015-09-04 — End: 2015-09-04
  Administered 2015-09-04: 15 mL via ORAL

## 2015-09-04 MED ORDER — MORPHINE SULFATE (PF) 4 MG/ML IV SOLN
INTRAVENOUS | Status: AC
  Administered 2015-09-04: 4 mg via INTRAVENOUS
  Filled 2015-09-04: qty 1

## 2015-09-04 MED ORDER — SODIUM CHLORIDE 0.9 % IV BOLUS (SEPSIS)
1000.0000 mL | Freq: Once | INTRAVENOUS | Status: AC
  Administered 2015-09-04: 1000 mL via INTRAVENOUS

## 2015-09-04 MED ORDER — ONDANSETRON HCL 4 MG/2ML IJ SOLN: 4.0000 mg | Freq: Four times a day (QID) | INTRAMUSCULAR | Status: DC | PRN

## 2015-09-04 MED ORDER — GI COCKTAIL ~~LOC~~
30.0000 mL | Freq: Once | ORAL | Status: AC
  Administered 2015-09-04: 30 mL via ORAL
  Filled 2015-09-04: qty 30

## 2015-09-04 MED ORDER — ACETAMINOPHEN 325 MG PO TABS: 650.0000 mg | ORAL_TABLET | Freq: Four times a day (QID) | ORAL | Status: DC | PRN

## 2015-09-04 MED ORDER — PANTOPRAZOLE SODIUM 40 MG IV SOLR
80.0000 mg | Freq: Once | INTRAVENOUS | Status: AC
  Administered 2015-09-04: 80 mg via INTRAVENOUS
  Filled 2015-09-04: qty 80

## 2015-09-04 MED ORDER — FAMOTIDINE IN NACL 20-0.9 MG/50ML-% IV SOLN
20.0000 mg | Freq: Once | INTRAVENOUS | Status: AC
  Administered 2015-09-04: 20 mg via INTRAVENOUS
  Filled 2015-09-04: qty 50

## 2015-09-04 MED ORDER — MORPHINE SULFATE (PF) 4 MG/ML IV SOLN
4.0000 mg | INTRAVENOUS | Status: DC | PRN
  Administered 2015-09-04: 4 mg via INTRAVENOUS
  Filled 2015-09-04: qty 1

## 2015-09-04 NOTE — Consult Note (Signed)
Patient with epigastric pain for several months, takes some NSAID infrequently, denies alcohol.  Never had EGD.  Will schedule this for tomorrow.  See Sandra [place] note for complete consult.

## 2015-09-04 NOTE — ED Notes (Signed)
Dr. Mayford KnifeWilliams notified of critical trop of 0.06

## 2015-09-04 NOTE — ED Notes (Signed)
Patient ambulatory to triage with steady gait, without difficulty, appears uncomfortable; pt reports mid abd pain since yesterday accomp by N/V and "acid reflux"

## 2015-09-04 NOTE — Progress Notes (Signed)
Patient admitted to unit. Oriented to room, call bell, and staff. Bed in lowest position. Fall safety plan reviewed. Full assessment to Epic. Skin assessment verified with Jennette Dubinoll F. RN. Telemetry box verification with tele clerk and Julaine HuaJenny F. NT- Box#: [id number]. Will continue to monitor.

## 2015-09-04 NOTE — Care Management (Signed)
Patient is without insurance.  She has no pcp but is followed by mental health.  She says she is on "workers comp" for the past year for a severe wrist strain.  has applied for medicaid in the past but has not been approved.  Provided patient with application for [facility].  She presents with abd pain and intractable n/v.  Informed that she will be placed in observation

## 2015-09-04 NOTE — ED Provider Notes (Addendum)
Labs Reviewed  COMPREHENSIVE METABOLIC PANEL - Abnormal; Notable for the following:    Chloride 99 (*)    Glucose, Bld 125 (*)    BUN 22 (*)    Creatinine, Ser 1.08 (*)    All other components within normal limits  CBC - Abnormal; Notable for the following:    WBC 21.5 (*)    All other components within normal limits  URINALYSIS COMPLETEWITH MICROSCOPIC ([facility] ONLY) - Abnormal; Notable for the following:    Color, Urine YELLOW (*)    APPearance HAZY (*)    Hgb urine dipstick 3+ (*)    Protein, ur 30 (*)    Squamous Epithelial / LPF 0-5 (*)    All other components within normal limits  CBC WITH DIFFERENTIAL/PLATELET - Abnormal; Notable for the following:    WBC 18.8 (*)    Neutro Abs 15.6 (*)    Monocytes Absolute 1.4 (*)    All other components within normal limits  TROPONIN I - Abnormal; Notable for the following:    Troponin I 0.06 (*)    All other components within normal limits  LIPASE, BLOOD  POC URINE PREG, ED    Troponin is found to be elevated of uncertain etiology but likely demand related. I believe her pain is from a bleeding ulcer. I'll recommend hospital observation and serial troponins.  Emily FilbertJonathan E Camdan Burdi, MD 09/04/15 [id number]  Emily FilbertJonathan E Ceazia Harb, MD 09/04/15 [phone]

## 2015-09-04 NOTE — ED Notes (Signed)
POCT Urine result NEGATIVE.

## 2015-09-04 NOTE — Consult Note (Signed)
Consultation  Referring Provider: Dr. Nemiah Commander  Primary Care Physician:  No PCP Per Patient Consulting  Gastroenterologist:   Dr. Lynnae Prude      Reason for Consultation: Severe epigastric pain            HPI:   Sandra Cantrell is a 50 y.o. female with a history of anxiety, depression, posttraumatic stress disorder, presents to the emergency department with severe epigastric pain.  She also reports episodes of vomiting x 8 at home and twice in the ER  with a few streaks of blood and pink tinged in the ER.  Vomiting did not make her pain better. She has subjective fever because she has had sweating despite air conditioning, and no CP or SOB.  She has not had a bowel movement for 2 days. She reports  normal stools.  Her abdominal/pelvic CT showed fatty liver. Troponin 0.06. She says she is feeling better since she has been receiving pain medication.  Now, her pain is 4/10. She last vomited during the night and is tolerating clear liquids well today.   Patient reports she attributes all of her stomach problems to beginning bupropion about 5 months ago. She states she has repeatedly asked her psychiatrist at [facility] to change it, to no avail.  In the past she would only have heartburn once in a while easily resolved with a Rolaids or Tum.  Since she has been on bupropion, she has had a flare of heartburn and indigestion real bad with burning up into the throat.  Sometimes it causes her to gag and she vomits maybe once a week.  Over the last week,  her symptoms have been progressive. Last night it escalated to the point where the pain was continuous  10/10 and she could not resolve it with her usual AA.   She seldom uses Excedrin,  last took it yesterday.  She  uses Tylenol rarely, no Advil or Aleve type products. She has been on no pain medication for her wrist since last year.  She denies alcohol or drug  use.  Positive smoker. She denies personal history of ulcers.  She denies dysphagia and reports  normal appetite, diet, and weight. Positive fatigue and a little cough. She has never been told that she has gallbladder problems. No history of EGD or colonoscopy.   Past Medical History  Diagnosis Date  . Post traumatic stress disorder (PTSD)   . Anxiety   . Depression     Past Surgical History  Procedure Laterality Date  . Tonsillectomy    . Dilation and curettage of uterus    . Tubal ligation    . Wrist arthroscopy Left 02/13/2015    Procedure: LEFT ARTHROSCOPY WRIST VOLAR GANGLION EXCISION;  Surgeon: Betha Loa, MD;  Location: [facility];  Service: Orthopedics;  Laterality: Left;  . Ganglion cyst excision Left 02/13/2015    Procedure: REMOVAL GANGLION OF WRIST;  Surgeon: Betha Loa, MD;  Location: [facility];  Service: Orthopedics;  Laterality: Left;    History reviewed. No pertinent family history.   Social History  Substance Use Topics  . Smoking status: Current Every Day Smoker -- 0.50 packs/day    Types: Cigarettes  . Smokeless tobacco: Never Used  . Alcohol Use: No    Prior to Admission medications   Medication Sig Start Date End Date Taking? Authorizing Provider  aspirin-acetaminophen-caffeine (EXCEDRIN MIGRAINE) [phone] MG tablet Take 1 tablet by mouth every 6 (six) hours as needed for headache.  Yes Historical Provider, MD  Brexpiprazole 1 MG TABS Take 2 tablets by mouth daily.   Yes Historical Provider, MD  buPROPion (WELLBUTRIN SR) 150 MG 12 hr tablet Take 300 mg by mouth daily.   Yes Historical Provider, MD  calcium carbonate (TUMS - DOSED IN MG ELEMENTAL CALCIUM) 500 MG chewable tablet Chew 1 tablet by mouth as needed for indigestion or heartburn.   Yes Historical Provider, MD    Current Facility-Administered Medications  Medication Dose Route Frequency Provider Last Rate Last Dose  . 0.9 %  sodium chloride infusion   Intravenous Continuous Enid Baas, MD 100 mL/hr at 09/04/15 1118    . acetaminophen (TYLENOL)  tablet 650 mg  650 mg Oral Q6H PRN Enid Baas, MD       Or  . acetaminophen (TYLENOL) suppository 650 mg  650 mg Rectal Q6H PRN Enid Baas, MD      . buPROPion (WELLBUTRIN XL) 24 hr tablet 150 mg  150 mg Oral Daily Enid Baas, MD   150 mg at 09/04/15 1108  . enoxaparin (LOVENOX) injection 40 mg  40 mg Subcutaneous Q24H Enid Baas, MD      . HYDROcodone-acetaminophen (NORCO/VICODIN) 5-325 MG per tablet 1 tablet  1 tablet Oral Q4H PRN Enid Baas, MD      . morphine 4 MG/ML injection 4 mg  4 mg Intravenous Q4H PRN Enid Baas, MD      . ondansetron (ZOFRAN) tablet 4 mg  4 mg Oral Q6H PRN Enid Baas, MD       Or  . ondansetron (ZOFRAN) injection 4 mg  4 mg Intravenous Q6H PRN Enid Baas, MD      . pantoprazole (PROTONIX) 80 mg in sodium chloride 0.9 % 250 mL (0.32 mg/mL) infusion  8 mg/hr Intravenous Continuous Enid Baas, MD 25 mL/hr at 09/04/15 0932 8 mg/hr at 09/04/15 0932  . [START ON 09/07/2015] pantoprazole (PROTONIX) injection 40 mg  40 mg Intravenous Q12H Enid Baas, MD        Allergies as of 09/04/2015 - Review Complete 09/04/2015  Allergen Reaction Noted  . Penicillins Anaphylaxis 02/06/2015     Review of Systems:    A 12 system review was obtained and all negative except where noted in HPI. She is having vaginal spotting-about to start her menses.     Physical Exam:  Vital signs in last 24 hours: Temp:  [98 F (36.7 C)-98.1 F (36.7 C)] 98 F (36.7 C) (05/04 1050) Pulse Rate:  [48-82] 54 (05/04 1050) Resp:  [9-20] 19 (05/04 1050) BP: (92-134)/(55-100) 96/58 mmHg (05/04 1050) SpO2:  [94 %-98 %] 98 % (05/04 1050) Weight:  [90.719 kg (200 lb)-95.165 kg (209 lb 12.8 oz)] 95.165 kg (209 lb 12.8 oz) (05/04 1050) Last BM Date: 09/02/15  General:  Well-developed, well-nourished and in no acute distress Head:  Head without obvious abnormality, atraumatic  Eyes:   Conjunctiva pink, sclera anicteric   ENT:   Mouth  free of lesions, mucosa moist, tongue pink, no thrush noted, teeth and gums normal Neck:   Supple w/o thyromegaly or mass, trachea midline, no adenopathy  Lungs: Clear to auscultation bilaterally, respirations unlabored Heart:     Normal S1S2, no rubs, murmurs, gallops. Abdomen: Soft, epigastric tender, no hepatosplenomegaly, hernia, or mass and BS normal Rectal: Deferred Lymph:  No cervical or supraclavicular adenopathy. Extremities:   No edema, cyanosis, or clubbing Skin  Skin color, texture, turgor normal, no rashes or lesions Neuro:  A&O x 3. CNII-XII intact, normal strength  Psych:  Appropriate mood and affect.  Data Reviewed:  LAB RESULTS:  Recent Labs  09/04/15 0316 09/04/15 0723  WBC 21.5* 18.8*  HGB 14.7 13.1  HCT 42.6 38.4  PLT 319 263   BMET  Recent Labs  09/04/15 0316  NA 136  K 3.8  CL 99*  CO2 25  GLUCOSE 125*  BUN 22*  CREATININE 1.08*  CALCIUM 9.9   LFT  Recent Labs  09/04/15 0316  PROT 8.0  ALBUMIN 4.5  AST 24  ALT 32  ALKPHOS 73  BILITOT 0.4   PT/INR No results for input(s): LABPROT, INR in the last 72 hours.  STUDIES: Ct Abdomen Pelvis W Contrast  09/04/2015  CLINICAL DATA:  Mid abdominal pain since yesterday. Nausea and vomiting. EXAM: CT ABDOMEN AND PELVIS WITH CONTRAST TECHNIQUE: Multidetector CT imaging of the abdomen and pelvis was performed using the standard protocol following bolus administration of intravenous contrast. CONTRAST:  100mL ISOVUE-300 IOPAMIDOL (ISOVUE-300) INJECTION 61% COMPARISON:  None. FINDINGS: The lung bases are clear.  Minimal esophageal hiatal hernia. Diffuse fatty infiltration of the liver. No focal liver lesions identified. The gallbladder, spleen, pancreas, adrenal glands, kidneys, abdominal aorta, inferior vena cava, and retroperitoneal lymph nodes are unremarkable. Stomach, small bowel, and colon are not abnormally distended. No free air or free fluid in the abdomen. Pelvis: The appendix is normal. Uterus and  ovaries are not enlarged. No free or loculated pelvic fluid collections. No pelvic mass or lymphadenopathy. Bladder wall is not thickened. No destructive bone lesions. IMPRESSION: Diffuse fatty infiltration of the liver. No acute process demonstrated in the abdomen or pelvis. No evidence of bowel obstruction or inflammation. Electronically Signed   By: Burman NievesWilliam  Stevens M.D.   On: 09/04/2015 06:36     Assessment:  Sandra Cantrell is a 50 y.o. with history of anxiety, depression, posttraumatic stress disorder, presents to the emergency department with severe epigastric pain, leukocytosis,  vomiting since yesterday  with occasional streaks of blood. Hgb good. No BM in 2 days. FL on CT. UA with blood- menstrual spotting. Etiology to consider gallbladder. She take Excedrin every 2 weeks or so, consider erosive gastritis, ulcer. Lipase is normal and pancreas is normal on CT. She has had fatigue. Smoker- and reports a little cough- consider CXR.   Plan:  Continue treatment for possible gastritis, gastric ulcer, peptic ulcer disease with IV Protonix bid.  Recommend H. pylori test.  She will not be an EGD luminal candidate until her cardiac status is clarified.  Her first troponin is slightly elevated. She is going to have her second troponin level drawn. She reports improvement in epigastric pain since admission and is tolerating clear liquids . She does not have a surgical abdominal exam and she is non toxic looking.  Consider US to further check the gallbladder. Avoid NSAIDs. Her Hgb is good, but would monitor.    This case was discussed with Dr. Scot Junobert T. Elliott in collaboration of care. Thank you for the consultation.  These services provided by Amedeo KinsmanKimberly Yevonne Yokum RN, MSN, ANP-BC under collaborative practice agreement with Scot Junobert T. Elliott, MD.  09/04/2015, 3:24 PM

## 2015-09-04 NOTE — H&P (Signed)
[facility] [place] at [facility]   PATIENT NAME: Sandra Cantrell    MR#:  [id number]  DATE OF BIRTH:  11/08/1965  DATE OF ADMISSION:  09/04/2015  PRIMARY CARE PHYSICIAN: No PCP Per Patient   REQUESTING/REFERRING PHYSICIAN: Dr. Theressa Millard  CHIEF COMPLAINT:   Chief Complaint  Patient presents with  . Abdominal Pain    HISTORY OF PRESENT ILLNESS:  Sandra Cantrell  is a 50 y.o. female with a known history of Posttraumatic stress disorder, migraine and seasonal allergies presents to the hospital secondary to worsening abdominal pain that started last night. Patient states over the past month she's been having intermittent abdominal pain worsen on empty stomach and slight improvement after eating. Denies any radiation. She was taking Wellbutrin for a long time but feels like that medication has caused GI side effects. Denies taking any BC powders. She takes occasional Excedrin maybe once in 2-3 weeks for her headaches. Denies any alcohol use.  - since last night she has been having 10 on 10 sharp epigastric pain radiating to the peri-umbilical region and also right and left upper quadrants. Also associated with nausea and multiple episodes of vomiting. Did not have hematemesis right away, after multiple episodes of vomiting she noticed streaks of blood. Denies any constipation or diarrhea. No fever or chills.   PAST MEDICAL HISTORY:   Past Medical History  Diagnosis Date  . Post traumatic stress disorder (PTSD)   . Anxiety   . Depression   . Stress headaches   . Multiple allergies     PAST SURGICAL HISTORY:   Past Surgical History  Procedure Laterality Date  . Tonsillectomy    . Dilation and curettage of uterus    . Tubal ligation    . Wrist arthroscopy Left 02/13/2015    Procedure: LEFT ARTHROSCOPY WRIST VOLAR GANGLION EXCISION;  Surgeon: Betha Loa, MD;  Location: [facility];  Service: Orthopedics;  Laterality: Left;  . Ganglion  cyst excision Left 02/13/2015    Procedure: REMOVAL GANGLION OF WRIST;  Surgeon: Betha Loa, MD;  Location: [facility];  Service: Orthopedics;  Laterality: Left;    SOCIAL HISTORY:   Social History  Substance Use Topics  . Smoking status: Current Every Day Smoker -- 0.75 packs/day    Types: Cigarettes  . Smokeless tobacco: Never Used  . Alcohol Use: No    FAMILY HISTORY:   Family History  Problem Relation Age of Onset  . Diabetes Mellitus II Father   . Diabetes Mellitus II Mother   . Breast cancer Maternal Aunt   . CAD Maternal Grandfather     DRUG ALLERGIES:   Allergies  Allergen Reactions  . Penicillins Anaphylaxis    Has patient had a PCN reaction causing immediate rash, facial/tongue/throat swelling, SOB or lightheadedness with hypotension: Yes Has patient had a PCN reaction causing severe rash involving mucus membranes or skin necrosis: Yes Has patient had a PCN reaction that required hospitalization No Has patient had a PCN reaction occurring within the last 10 years: Yes If all of the above answers are "NO", then may proceed with Cephalosporin use.     REVIEW OF SYSTEMS:   Review of Systems  Constitutional: Positive for chills and malaise/fatigue. Negative for fever and weight loss.  HENT: Positive for congestion. Negative for ear discharge, ear pain, hearing loss, nosebleeds and tinnitus.   Eyes: Negative for blurred vision, double vision and photophobia.  Respiratory: Positive for cough. Negative for hemoptysis, shortness of  breath and wheezing.   Cardiovascular: Negative for chest pain, palpitations, orthopnea and leg swelling.  Gastrointestinal: Positive for heartburn, nausea, vomiting and abdominal pain. Negative for diarrhea, constipation and melena.       Hematemesis  Genitourinary: Negative for dysuria, urgency, frequency and hematuria.  Musculoskeletal: Negative for myalgias, back pain and neck pain.  Skin: Negative for rash.   Neurological: Negative for dizziness, tingling, tremors, sensory change, speech change, focal weakness and headaches.  Endo/Heme/Allergies: Does not bruise/bleed easily.  Psychiatric/Behavioral: Negative for depression.    MEDICATIONS AT HOME:   Prior to Admission medications   Medication Sig Start Date End Date Taking? Authorizing Provider  aspirin-acetaminophen-caffeine (EXCEDRIN MIGRAINE) [phone] MG tablet Take 1 tablet by mouth every 6 (six) hours as needed for headache.   Yes Historical Provider, MD  Brexpiprazole 1 MG TABS Take 2 tablets by mouth daily.   Yes Historical Provider, MD  buPROPion (WELLBUTRIN SR) 150 MG 12 hr tablet Take 300 mg by mouth daily.   Yes Historical Provider, MD  calcium carbonate (TUMS - DOSED IN MG ELEMENTAL CALCIUM) 500 MG chewable tablet Chew 1 tablet by mouth as needed for indigestion or heartburn.   Yes Historical Provider, MD      VITAL SIGNS:  Blood pressure 96/58, pulse 54, temperature 98 F (36.7 C), temperature source Oral, resp. rate 19, height 5\' 5"  (1.651 m), weight 95.165 kg (209 lb 12.8 oz), last menstrual period 08/05/2015, SpO2 98 %.  PHYSICAL EXAMINATION:   Physical Exam  GENERAL:  50 y.o.-year-old patient lying in the bed with no acute distress.  EYES: Pupils equal, round, reactive to light and accommodation. No scleral icterus. Extraocular muscles intact.  HEENT: Head atraumatic, normocephalic. Oropharynx and nasopharynx clear.  NECK:  Supple, no jugular venous distention. No thyroid enlargement, no tenderness.  LUNGS: Normal breath sounds bilaterally, no wheezing, rales,rhonchi or crepitation. No use of accessory muscles of respiration.  CARDIOVASCULAR: S1, S2 normal. No murmurs, rubs, or gallops.  ABDOMEN: Soft, Tender in the epigastric region and also periumbilical region, no guarding or rigidity, nondistended. Bowel sounds present. No organomegaly or mass.  EXTREMITIES: No pedal edema, cyanosis, or clubbing.  NEUROLOGIC:  Cranial nerves II through XII are intact. Muscle strength 5/5 in all extremities. Sensation intact. Gait not checked.  PSYCHIATRIC: The patient is alert and oriented x 3.  SKIN: No obvious rash, lesion, or ulcer.   LABORATORY PANEL:   CBC  Recent Labs Lab 09/04/15 0723  WBC 18.8*  HGB 13.1  HCT 38.4  PLT 263   ------------------------------------------------------------------------------------------------------------------  Chemistries   Recent Labs Lab 09/04/15 0316  NA 136  K 3.8  CL 99*  CO2 25  GLUCOSE 125*  BUN 22*  CREATININE 1.08*  CALCIUM 9.9  AST 24  ALT 32  ALKPHOS 73  BILITOT 0.4   ------------------------------------------------------------------------------------------------------------------  Cardiac Enzymes  Recent Labs Lab 09/04/15 0316  TROPONINI 0.06*   ------------------------------------------------------------------------------------------------------------------  RADIOLOGY:  Ct Abdomen Pelvis W Contrast  09/04/2015  CLINICAL DATA:  Mid abdominal pain since yesterday. Nausea and vomiting. EXAM: CT ABDOMEN AND PELVIS WITH CONTRAST TECHNIQUE: Multidetector CT imaging of the abdomen and pelvis was performed using the standard protocol following bolus administration of intravenous contrast. CONTRAST:  100mL ISOVUE-300 IOPAMIDOL (ISOVUE-300) INJECTION 61% COMPARISON:  None. FINDINGS: The lung bases are clear.  Minimal esophageal hiatal hernia. Diffuse fatty infiltration of the liver. No focal liver lesions identified. The gallbladder, spleen, pancreas, adrenal glands, kidneys, abdominal aorta, inferior vena cava, and retroperitoneal lymph nodes are unremarkable.  Stomach, small bowel, and colon are not abnormally distended. No free air or free fluid in the abdomen. Pelvis: The appendix is normal. Uterus and ovaries are not enlarged. No free or loculated pelvic fluid collections. No pelvic mass or lymphadenopathy. Bladder wall is not thickened. No  destructive bone lesions. IMPRESSION: Diffuse fatty infiltration of the liver. No acute process demonstrated in the abdomen or pelvis. No evidence of bowel obstruction or inflammation. Electronically Signed   By: Burman Nieves M.D.   On: 09/04/2015 06:36    EKG:   Orders placed or performed during the hospital encounter of 09/04/15  . ED EKG  . ED EKG  . EKG 12-Lead  . EKG 12-Lead    IMPRESSION AND PLAN:   Nataly Pacifico  is a 50 y.o. female with a known history of Posttraumatic stress disorder, migraine and seasonal allergies presents to the hospital secondary to worsening abdominal pain that started last night.  #1 abdominal pain-likely acute gastritis. -Continue liquid diet. GI consulted. -Recheck hemoglobin every 8 hours. No active bleeding at this time. Started on Protonix drip. -CT of the abdomen with no other acute findings.  #2 leukocytosis-stress reaction likely from GI bleed and nausea vomiting. No evidence of infection noted so far. So hold off on antibiotics. Recheck tomorrow a.m.  #3 hematuria-patient started her menstrual cycle today. So continue to monitor.  #4 PTSD- patient suspects that her Wellbutrin might have started her symptoms. So continue to hold it at this time.  #5 DVT prophylaxis-on Lovenox  #6 elevated troponin-no chest pain, no EKG changes. No prior cardiac history. -Likely demand ischemia. Recheck troponins every 6 hours. Monitor on telemetry.    All the records are reviewed and case discussed with ED provider. Management plans discussed with the patient, family and they are in agreement.  CODE STATUS: Full Code  TOTAL TIME TAKING CARE OF THIS PATIENT: 50 minutes.    Enid Baas M.D on 09/04/2015 at 4:22 PM  Between 7am to 6pm - Pager - [phone]  After 6pm go to [email or web address] - password [email or web address] [facility]  [facility]  Office  [phone]  CC: Primary care physician; No PCP Per Patient

## 2015-09-04 NOTE — ED Notes (Signed)
No IV inserted, documented in error

## 2015-09-04 NOTE — ED Provider Notes (Addendum)
[facility] Emergency Department Provider Note   ____________________________________________  Time seen: Approximately 430 AM  I have reviewed the triage vital signs and the nursing notes.   HISTORY  Chief Complaint Abdominal Pain   HPI Sandra Cantrell is a 50 y.o. female with a history of anxiety and depression who is presenting to the emergency department with mid abdominal pain which is been ongoing for the past day. Says the pain is cramping as well as sharp and intermittent. She says it is severe at this time and nonradiating. She has had about 6 episodes of vomiting. Says a small amount of blood streaks in her vomitus. Denies any diarrhea. Says that she has her tubes tied. Denies any burning with urination. Denies any abdominal surgeries in the past. Says that she has had abdominal pain with her medications but says that this is the worst that she has ever had.   Past Medical History  Diagnosis Date  . Post traumatic stress disorder (PTSD)   . Anxiety   . Depression     There are no active problems to display for this patient.   Past Surgical History  Procedure Laterality Date  . Tonsillectomy    . Dilation and curettage of uterus    . Tubal ligation    . Wrist arthroscopy Left 02/13/2015    Procedure: LEFT ARTHROSCOPY WRIST VOLAR GANGLION EXCISION;  Surgeon: Betha Loa, MD;  Location: [facility];  Service: Orthopedics;  Laterality: Left;  . Ganglion cyst excision Left 02/13/2015    Procedure: REMOVAL GANGLION OF WRIST;  Surgeon: Betha Loa, MD;  Location:  SURGERY CENTER;  Service: Orthopedics;  Laterality: Left;    Current Outpatient Rx  Name  Route  Sig  Dispense  Refill  . buPROPion (WELLBUTRIN XL) 150 MG 24 hr tablet   Oral   Take 150 mg by mouth daily.         Marland Kitchen HYDROcodone-acetaminophen (NORCO/VICODIN) 5-325 MG tablet   Oral   Take 1 tablet by mouth every 4 (four) hours as needed for moderate  pain.   15 tablet   0   . oxyCODONE-acetaminophen (PERCOCET) 5-325 MG tablet      1-2 tabs po q6 hours prn pain   30 tablet   0   . sulfamethoxazole-trimethoprim (BACTRIM DS,SEPTRA DS) 800-160 MG tablet   Oral   Take 1 tablet by mouth 2 (two) times daily.   14 tablet   0     Allergies Penicillins  No family history on file.  Social History Social History  Substance Use Topics  . Smoking status: Current Every Day Smoker -- 0.50 packs/day    Types: Cigarettes  . Smokeless tobacco: Never Used  . Alcohol Use: No    Review of Systems Constitutional: No fever/chills Eyes: No visual changes. ENT: No sore throat. Cardiovascular: Denies chest pain. Respiratory: Denies shortness of breath. Gastrointestinal:no constipation. Genitourinary: Negative for dysuria. Musculoskeletal: Negative for back pain. Skin: Negative for rash. Neurological: Negative for headaches, focal weakness or numbness.  10-point ROS otherwise negative.  ____________________________________________   PHYSICAL EXAM:  VITAL SIGNS: ED Triage Vitals  Enc Vitals Group     BP 09/04/15 0314 134/100 mmHg     Pulse Rate 09/04/15 0314 80     Resp 09/04/15 0314 18     Temp 09/04/15 0314 98.1 F (36.7 C)     Temp Source 09/04/15 0314 Oral     SpO2 09/04/15 0314 98 %  Weight 09/04/15 0314 200 lb (90.719 kg)     Height 09/04/15 0314  (1.651 m)     Head Cir --      Peak Flow --      Pain Score 09/04/15 0314 10     Pain Loc --      Pain Edu? --      Excl. in GC? --     Constitutional: Alert and oriented. Visibly uncomfortable Eyes: Conjunctivae are normal. PERRL. EOMI. Head: Atraumatic. Nose: No congestion/rhinnorhea. Mouth/Throat: Mucous membranes are moist.  Oropharynx non-erythematous. Neck: No stridor.   Cardiovascular: Normal rate, regular rhythm. Grossly normal heart sounds.  Good peripheral circulation. Respiratory: Normal respiratory effort.  No retractions. Lungs  CTAB. Gastrointestinal: Soft with diffuse abdominal tenderness to palpation which is worse to the upper abdomen as well as left upper quadrant. No distention. No CVA tenderness. Musculoskeletal: No lower extremity tenderness nor edema.  No joint effusions. Neurologic:  Normal speech and language. No gross focal neurologic deficits are appreciated.  Skin:  Skin is warm, dry and intact. No rash noted. Psychiatric: Mood and affect are normal. Speech and behavior are normal.  ____________________________________________   LABS (all labs ordered are listed, but only abnormal results are displayed)  Labs Reviewed  COMPREHENSIVE METABOLIC PANEL - Abnormal; Notable for the following:    Chloride 99 (*)    Glucose, Bld 125 (*)    BUN 22 (*)    Creatinine, Ser 1.08 (*)    All other components within normal limits  CBC - Abnormal; Notable for the following:    WBC 21.5 (*)    All other components within normal limits  URINALYSIS COMPLETEWITH MICROSCOPIC ([facility] ONLY) - Abnormal; Notable for the following:    Color, Urine YELLOW (*)    APPearance HAZY (*)    Hgb urine dipstick 3+ (*)    Protein, ur 30 (*)    Squamous Epithelial / LPF 0-5 (*)    All other components within normal limits  LIPASE, BLOOD  CBC WITH DIFFERENTIAL/PLATELET  POC URINE PREG, ED   ____________________________________________  EKG   ____________________________________________  RADIOLOGY  CT Abdomen Pelvis W Contrast (Final result) Result time: 09/04/15 06:36:17   Final result by Rad Results In Interface (09/04/15 06:36:17)   Narrative:   CLINICAL DATA: Mid abdominal pain since yesterday. Nausea and vomiting.  EXAM: CT ABDOMEN AND PELVIS WITH CONTRAST  TECHNIQUE: Multidetector CT imaging of the abdomen and pelvis was performed using the standard protocol following bolus administration of intravenous contrast.  CONTRAST: ISOVUE-300 IOPAMIDOL (ISOVUE-300) INJECTION 61%  COMPARISON:  None.  FINDINGS: The lung bases are clear. Minimal esophageal hiatal hernia.  Diffuse fatty infiltration of the liver. No focal liver lesions identified. The gallbladder, spleen, pancreas, adrenal glands, kidneys, abdominal aorta, inferior vena cava, and retroperitoneal lymph nodes are unremarkable. Stomach, small bowel, and colon are not abnormally distended. No free air or free fluid in the abdomen.  Pelvis: The appendix is normal. Uterus and ovaries are not enlarged. No free or loculated pelvic fluid collections. No pelvic mass or lymphadenopathy. Bladder wall is not thickened. No destructive bone lesions.  IMPRESSION: Diffuse fatty infiltration of the liver. No acute process demonstrated in the abdomen or pelvis. No evidence of bowel obstruction or inflammation.   Electronically Signed By: Burman Nieves M.D. On: 09/04/2015 06:36    ____________________________________________   PROCEDURES  ____________________________________________   INITIAL IMPRESSION / ASSESSMENT AND PLAN / ED COURSE  Pertinent labs & imaging results that were available  during my care of the patient were reviewed by me and considered in my medical decision making (see chart for details).  ----------------------------------------- 7:16 AM on 09/04/2015 -----------------------------------------  Patient with only minimal relief after morphine, Pepcid and GI cocktail. Very reassuring CAT scan of her abdomen and pelvis. We'll check EKG. Sending second CBC. We'll also redosed morphine. Signed out to Dr. Mayford KnifeWilliams. Patient found to have blood in her urine but the patient says that she is spotting and starting her period. ____________________________________________   FINAL CLINICAL IMPRESSION(S) / ED DIAGNOSES  Abdominal pain with nausea and vomiting.    NEW MEDICATIONS STARTED DURING THIS VISIT:  New Prescriptions   No medications on file     Note:  This document was prepared  using Dragon voice recognition software and may include unintentional dictation errors.    Myrna Blazeravid Matthew Tinsley Lomas, MD 09/04/15 [phone]  ED ECG REPORT I, Arelia LongestSchaevitz,  Shamarie Call M, the attending physician, personally viewed and interpreted this ECG.   Date: 09/04/2015  EKG Time: 720  Rate: 55  Rhythm: normal sinus rhythm  Axis: Normal axis  Intervals:none  ST&T Change: No ST segment elevation or depression. No abnormal T-wave inversion.   Myrna Blazeravid Matthew Jax Abdelrahman, MD 09/04/15 [id number]

## 2015-09-04 NOTE — ED Notes (Addendum)
Family at bedside. 

## 2015-09-04 NOTE — ED Notes (Signed)
Family at bedside, pt resting in bed in no acute distress

## 2015-09-05 ENCOUNTER — Inpatient Hospital Stay: Payer: MEDICAID | Admitting: Anesthesiology

## 2015-09-05 ENCOUNTER — Encounter
Admission: EM | Disposition: A | Discharge status: Home / Self Care | Payer: Self-pay | Source: Home / Self Care | Attending: Internal Medicine

## 2015-09-05 DIAGNOSIS — F4310 Post-traumatic stress disorder, unspecified: Secondary | ICD-10-CM | POA: Insufficient documentation

## 2015-09-05 HISTORY — PX: ESOPHAGOGASTRODUODENOSCOPY: SHX5428

## 2015-09-05 LAB — CBC
HCT: 36.2 % (ref 35.0–47.0)
Hemoglobin: 12.2 g/dL (ref 12.0–16.0)
MCH: 31.2 pg (ref 26.0–34.0)
MCHC: 33.6 g/dL (ref 32.0–36.0)
MCV: 92.8 fL (ref 80.0–100.0)
PLATELETS: 234 10*3/uL (ref 150–440)
RBC: 3.9 MIL/uL (ref 3.80–5.20)
RDW: 14.1 % (ref 11.5–14.5)
WBC: 12.1 10*3/uL — AB (ref 3.6–11.0)

## 2015-09-05 LAB — BASIC METABOLIC PANEL
Anion gap: 8 (ref 5–15)
BUN: 15 mg/dL (ref 6–20)
CHLORIDE: 109 mmol/L (ref 101–111)
CO2: 24 mmol/L (ref 22–32)
CREATININE: 0.97 mg/dL (ref 0.44–1.00)
Calcium: 8.1 mg/dL — ABNORMAL LOW (ref 8.9–10.3)
Glucose, Bld: 111 mg/dL — ABNORMAL HIGH (ref 65–99)
Potassium: 4.1 mmol/L (ref 3.5–5.1)
Sodium: 141 mmol/L (ref 135–145)

## 2015-09-05 SURGERY — EGD (ESOPHAGOGASTRODUODENOSCOPY)
Anesthesia: General

## 2015-09-05 MED ORDER — PROPOFOL 500 MG/50ML IV EMUL
INTRAVENOUS | Status: DC | PRN
  Administered 2015-09-05: 100 ug/kg/min via INTRAVENOUS

## 2015-09-05 MED ORDER — AMITRIPTYLINE HCL 50 MG PO TABS
25.0000 mg | ORAL_TABLET | Freq: Every day | ORAL | Status: DC
  Administered 2015-09-05: 25 mg via ORAL
  Filled 2015-09-05: qty 1

## 2015-09-05 MED ORDER — PANTOPRAZOLE SODIUM 40 MG IV SOLR
40.0000 mg | Freq: Two times a day (BID) | INTRAVENOUS | Status: DC
  Administered 2015-09-05 – 2015-09-06 (×3): 40 mg via INTRAVENOUS
  Filled 2015-09-05 (×3): qty 40

## 2015-09-05 MED ORDER — IPRATROPIUM-ALBUTEROL 0.5-2.5 (3) MG/3ML IN SOLN
RESPIRATORY_TRACT | Status: AC
  Administered 2015-09-05: 3 mL via RESPIRATORY_TRACT
  Filled 2015-09-05: qty 3

## 2015-09-05 MED ORDER — IPRATROPIUM-ALBUTEROL 0.5-2.5 (3) MG/3ML IN SOLN
3.0000 mL | Freq: Once | RESPIRATORY_TRACT | Status: AC
  Administered 2015-09-05: 3 mL via RESPIRATORY_TRACT

## 2015-09-05 MED ORDER — FLUOXETINE HCL 20 MG PO CAPS
20.0000 mg | ORAL_CAPSULE | Freq: Every day | ORAL | Status: DC
  Administered 2015-09-05 – 2015-09-06 (×2): 20 mg via ORAL
  Filled 2015-09-05 (×2): qty 1

## 2015-09-05 MED ORDER — LIDOCAINE HCL (PF) 2 % IJ SOLN
INTRAMUSCULAR | Status: DC | PRN
  Administered 2015-09-05: 50 mg

## 2015-09-05 MED ORDER — MIDAZOLAM HCL 5 MG/5ML IJ SOLN
INTRAMUSCULAR | Status: DC | PRN
  Administered 2015-09-05: 2 mg via INTRAVENOUS

## 2015-09-05 MED ORDER — FENTANYL CITRATE (PF) 100 MCG/2ML IJ SOLN
INTRAMUSCULAR | Status: DC | PRN
  Administered 2015-09-05: 50 ug via INTRAVENOUS

## 2015-09-05 MED ORDER — SUCRALFATE 1 GM/10ML PO SUSP
1.0000 g | Freq: Three times a day (TID) | ORAL | Status: DC
  Administered 2015-09-05 – 2015-09-06 (×5): 1 g via ORAL
  Filled 2015-09-05 (×5): qty 10

## 2015-09-05 MED ORDER — GLYCOPYRROLATE 0.2 MG/ML IJ SOLN
INTRAMUSCULAR | Status: DC | PRN
  Administered 2015-09-05: 0.2 mg via INTRAVENOUS

## 2015-09-05 MED ORDER — PROPOFOL 10 MG/ML IV BOLUS
INTRAVENOUS | Status: DC | PRN
  Administered 2015-09-05: 20 mg via INTRAVENOUS
  Administered 2015-09-05: 50 mg via INTRAVENOUS

## 2015-09-05 NOTE — Consult Note (Signed)
[facility] Consult   Reason for Consult:  Consult for 50 year old woman with a history of posttraumatic stress disorder currently in the hospital for abdominal pain Referring Physician:  Tressia Miners Patient Identification: TIARI ANDRINGA MRN:  [id number] Principal Diagnosis: PTSD Diagnosis:   Patient Active Problem List   Diagnosis Date Noted  . Post traumatic stress disorder (PTSD) [F43.10] 09/05/2015  . Abdominal pain [R10.9] 09/04/2015    Total Time spent with patient: 1 hour  Subjective:   MOANA MUNFORD is a 50 y.o. female patient admitted with "I can't take that medicine".  HPI:  Patient interviewed. Chart reviewed including current and old her notes. Labs and vitals reviewed. Patient is a 50 year old woman came in the hospital with abdominal pain. Scope shows an ulcer. Patient was interviewed today and states that her mood at home has recently been depressed and down. Feels negative much of the time. Has little motivation and doesn't get out of the house much. Feels tired a lot. Sleep is restless with frequent awakening. Denies any hallucinations or psychotic symptoms. Denies any suicidal ideation at all. She is currently receiving treatment through [facility] and sees a therapist and receives medication and also receives peer support. She is apparently currently taking bupropion and was recently started on wrecked salty. Patient is convinced that the bupropion has been upsetting her stomach the whole time she has been taking it. She blames it for her current ulcer and says she cannot take it anymore. On top of that it doesn't sound like it's been treating her symptoms of depression and anxiety. She also complains of a lot of social anxiety. Also has significant irritability loses her temper easily. No violence however no suicidality.   Patient is living by herself. She is not working. Spends most of her time at home by herself. She does get peer support visits twice a week.  She has family that she seems to have pretty good support from  Medical history: Current workup for abdominal pain with gastritis. She says she is not taking any other medicines regularly.  Substance abuse history: Says she drinks very infrequently and not abusively. Denies any history of alcohol or drug abuse.  Past Psychiatric History: One prior psychiatric hospitalization several years ago for an impulsive overdose. No other history of suicidal behavior. Gets her outpatient treatment at [facility]. Denies any history of substance abuse. She says she has been on medicines in the past in addition to what she is taking now but when I listed all of the serotonin reuptake inhibitor she did not recognize any of them.  Risk to Self: Is patient at risk for suicide?: No Risk to Others:   Prior Inpatient Therapy:   Prior Outpatient Therapy:    Past Medical History:  Past Medical History  Diagnosis Date  . Post traumatic stress disorder (PTSD)   . Anxiety   . Depression   . Stress headaches   . Multiple allergies     Past Surgical History  Procedure Laterality Date  . Tonsillectomy    . Dilation and curettage of uterus    . Tubal ligation    . Wrist arthroscopy Left 02/13/2015    Procedure: LEFT ARTHROSCOPY WRIST VOLAR GANGLION EXCISION;  Surgeon: Leanora Cover, MD;  Location: [place];  Service: Orthopedics;  Laterality: Left;  . Ganglion cyst excision Left 02/13/2015    Procedure: REMOVAL GANGLION OF WRIST;  Surgeon: Leanora Cover, MD;  Location: [place];  Service: Orthopedics;  Laterality:  Left;   Family History:  Family History  Problem Relation Age of Onset  . Diabetes Mellitus II Father   . Diabetes Mellitus II Mother   . Breast cancer Maternal Aunt   . CAD Maternal Grandfather    Family Psychiatric  History: Patient says there is extensive anxiety in her family and her mother's sister and daughter also at least one person with bipolar disorder Social  History:  History  Alcohol Use No     History  Drug Use No    Social History   Social History  . Marital Status: Single    Spouse Name: N/A  . Number of Children: N/A  . Years of Education: N/A   Social History Main Topics  . Smoking status: Current Every Day Smoker -- 0.75 packs/day    Types: Cigarettes  . Smokeless tobacco: Never Used  . Alcohol Use: No  . Drug Use: No  . Sexual Activity: Yes   Other Topics Concern  . None   Social History Narrative   Lives at home with husband   Additional Social History:    Allergies:   Allergies  Allergen Reactions  . Penicillins Anaphylaxis    Has patient had a PCN reaction causing immediate rash, facial/tongue/throat swelling, SOB or lightheadedness with hypotension: Yes Has patient had a PCN reaction causing severe rash involving mucus membranes or skin necrosis: Yes Has patient had a PCN reaction that required hospitalization No Has patient had a PCN reaction occurring within the last 10 years: Yes If all of the above answers are "NO", then may proceed with Cephalosporin use.     Labs:  Results for orders placed or performed during the hospital encounter of 09/04/15 (from the past 48 hour(s))  Lipase, blood     Status: None   Collection Time: 09/04/15  3:16 AM  Result Value Ref Range   Lipase 34 11 - 51 U/L  Comprehensive metabolic panel     Status: Abnormal   Collection Time: 09/04/15  3:16 AM  Result Value Ref Range   Sodium 136 135 - 145 mmol/L   Potassium 3.8 3.5 - 5.1 mmol/L   Chloride 99 (L) 101 - 111 mmol/L   CO2 25 22 - 32 mmol/L   Glucose, Bld 125 (H) 65 - 99 mg/dL   BUN 22 (H) 6 - 20 mg/dL   Creatinine, Ser 1.08 (H) 0.44 - 1.00 mg/dL   Calcium 9.9 8.9 - 10.3 mg/dL   Total Protein 8.0 6.5 - 8.1 g/dL   Albumin 4.5 3.5 - 5.0 g/dL   AST 24 15 - 41 U/L   ALT 32 14 - 54 U/L   Alkaline Phosphatase 73 38 - 126 U/L   Total Bilirubin 0.4 0.3 - 1.2 mg/dL   GFR calc non Af Amer >60 >60 mL/min   GFR calc Af  Amer >60 >60 mL/min    Comment: (NOTE) The eGFR has been calculated using the CKD EPI equation. This calculation has not been validated in all clinical situations. eGFR's persistently <60 mL/min signify possible Chronic Kidney Disease.    Anion gap 12 5 - 15  CBC     Status: Abnormal   Collection Time: 09/04/15  3:16 AM  Result Value Ref Range   WBC 21.5 (H) 3.6 - 11.0 K/uL   RBC 4.70 3.80 - 5.20 MIL/uL   Hemoglobin 14.7 12.0 - 16.0 g/dL   HCT 42.6 35.0 - 47.0 %   MCV 90.6 80.0 - 100.0 fL   MCH  31.3 26.0 - 34.0 pg   MCHC 34.5 32.0 - 36.0 g/dL   RDW 13.8 11.5 - 14.5 %   Platelets 319 150 - 440 K/uL  Troponin I     Status: Abnormal   Collection Time: 09/04/15  3:16 AM  Result Value Ref Range   Troponin I 0.06 (H) <0.031 ng/mL    Comment: READ BACK AND VERIFIED WITH OLIVIA BROOMER AT 0805 09/04/15 DAS        PERSISTENTLY INCREASED TROPONIN VALUES IN THE RANGE OF 0.04-0.49 ng/mL CAN BE SEEN IN:       -UNSTABLE ANGINA       -CONGESTIVE HEART FAILURE       -MYOCARDITIS       -CHEST TRAUMA       -ARRYHTHMIAS       -LATE PRESENTING MYOCARDIAL INFARCTION       -COPD   CLINICAL FOLLOW-UP RECOMMENDED.   Urinalysis complete, with microscopic     Status: Abnormal   Collection Time: 09/04/15  5:00 AM  Result Value Ref Range   Color, Urine YELLOW (A) YELLOW   APPearance HAZY (A) CLEAR   Glucose, UA NEGATIVE NEGATIVE mg/dL   Bilirubin Urine NEGATIVE NEGATIVE   Ketones, ur NEGATIVE NEGATIVE mg/dL   Specific Gravity, Urine 1.030 1.005 - 1.030   Hgb urine dipstick 3+ (A) NEGATIVE   pH 6.0 5.0 - 8.0   Protein, ur 30 (A) NEGATIVE mg/dL   Nitrite NEGATIVE NEGATIVE   Leukocytes, UA NEGATIVE NEGATIVE   RBC / HPF TOO NUMEROUS TO COUNT 0 - 5 RBC/hpf   WBC, UA 0-5 0 - 5 WBC/hpf   Bacteria, UA NONE SEEN NONE SEEN   Squamous Epithelial / LPF 0-5 (A) NONE SEEN   Mucous PRESENT   CBC with Differential/Platelet     Status: Abnormal   Collection Time: 09/04/15  7:23 AM  Result Value Ref  Range   WBC 18.8 (H) 3.6 - 11.0 K/uL   RBC 4.20 3.80 - 5.20 MIL/uL   Hemoglobin 13.1 12.0 - 16.0 g/dL   HCT 38.4 35.0 - 47.0 %   MCV 91.4 80.0 - 100.0 fL   MCH 31.1 26.0 - 34.0 pg   MCHC 34.0 32.0 - 36.0 g/dL   RDW 13.6 11.5 - 14.5 %   Platelets 263 150 - 440 K/uL   Neutrophils Relative % 83% %   Neutro Abs 15.6 (H) 1.4 - 6.5 K/uL   Lymphocytes Relative 10% %   Lymphs Abs 1.8 1.0 - 3.6 K/uL   Monocytes Relative 7% %   Monocytes Absolute 1.4 (H) 0.2 - 0.9 K/uL   Eosinophils Relative 0% %   Eosinophils Absolute 0.0 0 - 0.7 K/uL   Basophils Relative 0% %   Basophils Absolute 0.1 0 - 0.1 K/uL  Troponin I (q 6hr x 3)     Status: None   Collection Time: 09/04/15  4:30 PM  Result Value Ref Range   Troponin I <0.03 <0.031 ng/mL    Comment:        NO INDICATION OF MYOCARDIAL INJURY.   Hemoglobin     Status: None   Collection Time: 09/04/15  4:52 PM  Result Value Ref Range   Hemoglobin 13.1 12.0 - 16.0 g/dL  Basic metabolic panel     Status: Abnormal   Collection Time: 09/05/15 12:56 AM  Result Value Ref Range   Sodium 141 135 - 145 mmol/L   Potassium 4.1 3.5 - 5.1 mmol/L   Chloride 109 101 -  111 mmol/L   CO2 24 22 - 32 mmol/L   Glucose, Bld 111 (H) 65 - 99 mg/dL   BUN 15 6 - 20 mg/dL   Creatinine, Ser 0.97 0.44 - 1.00 mg/dL   Calcium 8.1 (L) 8.9 - 10.3 mg/dL   GFR calc non Af Amer >60 >60 mL/min   GFR calc Af Amer >60 >60 mL/min    Comment: (NOTE) The eGFR has been calculated using the CKD EPI equation. This calculation has not been validated in all clinical situations. eGFR's persistently <60 mL/min signify possible Chronic Kidney Disease.    Anion gap 8 5 - 15  CBC     Status: Abnormal   Collection Time: 09/05/15 12:56 AM  Result Value Ref Range   WBC 12.1 (H) 3.6 - 11.0 K/uL   RBC 3.90 3.80 - 5.20 MIL/uL   Hemoglobin 12.2 12.0 - 16.0 g/dL   HCT 36.2 35.0 - 47.0 %   MCV 92.8 80.0 - 100.0 fL   MCH 31.2 26.0 - 34.0 pg   MCHC 33.6 32.0 - 36.0 g/dL   RDW 14.1 11.5 -  14.5 %   Platelets 234 150 - 440 K/uL    Current Facility-Administered Medications  Medication Dose Route Frequency Provider Last Rate Last Dose  . 0.9 %  sodium chloride infusion   Intravenous Continuous Gladstone Lighter, MD 100 mL/hr at 09/05/15 0555    . acetaminophen (TYLENOL) tablet 650 mg  650 mg Oral Q6H PRN Gladstone Lighter, MD       Or  . acetaminophen (TYLENOL) suppository 650 mg  650 mg Rectal Q6H PRN Gladstone Lighter, MD      . amitriptyline (ELAVIL) tablet 25 mg  25 mg Oral QHS Gladstone Lighter, MD      . enoxaparin (LOVENOX) injection 40 mg  40 mg Subcutaneous Q24H Gladstone Lighter, MD   40 mg at 09/04/15 2021  . FLUoxetine (PROZAC) capsule 20 mg  20 mg Oral Daily Gonzella Lex, MD      . HYDROcodone-acetaminophen (NORCO/VICODIN) 5-325 MG per tablet 1 tablet  1 tablet Oral Q4H PRN Gladstone Lighter, MD      . morphine 4 MG/ML injection 4 mg  4 mg Intravenous Q4H PRN Gladstone Lighter, MD   4 mg at 09/04/15 1717  . ondansetron (ZOFRAN) tablet 4 mg  4 mg Oral Q6H PRN Gladstone Lighter, MD       Or  . ondansetron (ZOFRAN) injection 4 mg  4 mg Intravenous Q6H PRN Gladstone Lighter, MD      . pantoprazole (PROTONIX) injection 40 mg  40 mg Intravenous Q12H Gladstone Lighter, MD   40 mg at 09/05/15 1204  . sucralfate (CARAFATE) 1 GM/10ML suspension 1 g  1 g Oral TID WC & HS Gladstone Lighter, MD   1 g at 09/05/15 1204    Musculoskeletal: Strength & Muscle Tone: within normal limits Gait & Station: normal Patient leans: N/A  Psychiatric Specialty Exam: Review of Systems  Constitutional: Negative.   HENT: Negative.   Eyes: Negative.   Respiratory: Negative.   Cardiovascular: Negative.   Gastrointestinal: Positive for heartburn and abdominal pain.  Musculoskeletal: Negative.   Skin: Negative.   Neurological: Negative.   Psychiatric/Behavioral: Positive for depression. Negative for suicidal ideas, hallucinations, memory loss and substance abuse. The patient is  nervous/anxious and has insomnia.     Blood pressure 106/47, pulse 66, temperature 97.6 F (36.4 C), temperature source Oral, resp. rate 17, height 5' 5"  (1.651 m), weight 95.165 kg (209  lb 12.8 oz), last menstrual period 08/05/2015, SpO2 98 %.Body mass index is 34.91 kg/(m^2).  General Appearance: Casual  Eye Contact::  Good  Speech:  Clear and Coherent  Volume:  Decreased  Mood:  Anxious and Depressed  Affect:  Tearful  Thought Process:  Coherent  Orientation:  Full (Time, Place, and Person)  Thought Content:  Negative  Suicidal Thoughts:  No  Homicidal Thoughts:  No  Memory:  Immediate;   Good Recent;   Fair Remote;   Good  Judgement:  Fair  Insight:  Fair  Psychomotor Activity:  Normal  Concentration:  Fair  Recall:  [employer] of Knowledge:Fair  Language: Fair  Akathisia:  No  Handed:  Right  AIMS (if indicated):     Assets:  Communication Skills Desire for Improvement Housing Resilience Social Support  ADL's:  Intact  Cognition: WNL  Sleep:      Treatment Plan Summary: Daily contact with patient to assess and evaluate symptoms and progress in treatment, Medication management and Plan Patient is strongly of the opinion that bupropion is causing side effects. It also does not appear to be very effective for her symptoms. I'm not surprised by this is most of the symptoms she is describing sound more like anxiety which bupropion is usually not effective for. I agree with her plan to discontinue the bupropion. We discussed things she might substitute. She is very concerned about price. I suggested fluoxetine as an appropriate treatment for anxiety depression and PTSD which is very inexpensive and usually well tolerated. Side effects reviewed. Start Prozac 20 mg per day. Encourage her to continue following up with outpatient mental health care in the community. She should be given a prescription for the Prozac at discharge if she tolerates it okay. Patient is agreeable to the  plan. No need for inpatient psychiatric treatment.  Disposition: Patient does not meet criteria for psychiatric inpatient admission. Supportive therapy provided about ongoing stressors.  Alethia Berthold, MD 09/05/2015 3:40 PM

## 2015-09-05 NOTE — Anesthesia Preprocedure Evaluation (Addendum)
Anesthesia Evaluation  Patient identified by MRN, date of birth, ID band Patient awake    Reviewed: Allergy & Precautions, H&P , NPO status , Patient's Chart, lab work & pertinent test results  Airway Mallampati: III  TM Distance: >3 FB Neck ROM: limited    Dental  (+) Poor Dentition, Chipped   Pulmonary neg pulmonary ROS, neg shortness of breath, Current Smoker,    Pulmonary exam normal breath sounds clear to auscultation       Cardiovascular Exercise Tolerance: Good (-) angina(-) Past MI and (-) DOE negative cardio ROS Normal cardiovascular exam Rhythm:regular Rate:Normal     Neuro/Psych PSYCHIATRIC DISORDERS Anxiety Depression negative neurological ROS     GI/Hepatic Neg liver ROS, GERD  Controlled and Medicated,  Endo/Other  negative endocrine ROS  Renal/GU negative Renal ROS  negative genitourinary   Musculoskeletal   Abdominal   Peds  Hematology negative hematology ROS (+)   Anesthesia Other Findings Past Medical History:   Post traumatic stress disorder (PTSD)                        Anxiety                                                      Depression                                                   Stress headaches                                             Multiple allergies                                          Past Surgical History:   TONSILLECTOMY                                                 DILATION AND CURETTAGE OF UTERUS                              TUBAL LIGATION                                                WRIST ARTHROSCOPY                               Left 02/13/2015     Comment:Procedure: LEFT ARTHROSCOPY WRIST VOLAR               GANGLION EXCISION;  Surgeon: Betha Loa, MD;  Location: [facility];  Service:               Orthopedics;  Laterality: Left;   GANGLION CYST EXCISION                          Left 02/13/2015     Comment:Procedure: REMOVAL  GANGLION OF WRIST;  Surgeon:              Betha LoaKevin Kuzma, MD;  Location:  SURGERY               CENTER;  Service: Orthopedics;  Laterality:               Left;  BMI    Body Mass Index   34.91 kg/m 2    Patient is NPO appropriate and reports no nausea or vomiting today.     Reproductive/Obstetrics negative OB ROS                            Anesthesia Physical Anesthesia Plan  ASA: III  Anesthesia Plan: General   Post-op Pain Management:    Induction:   Airway Management Planned:   Additional Equipment:   Intra-op Plan:   Post-operative Plan:   Informed Consent: I have reviewed the patients History and Physical, chart, labs and discussed the procedure including the risks, benefits and alternatives for the proposed anesthesia with the patient or authorized representative who has indicated his/her understanding and acceptance.   Dental Advisory Given  Plan Discussed with: Anesthesiologist, CRNA and Surgeon  Anesthesia Plan Comments:         Anesthesia Quick Evaluation

## 2015-09-05 NOTE — Op Note (Signed)
[facility] Gastroenterology Patient Name: Sandra LemaLinda Brennan Procedure Date: 09/05/2015 9:30 AM MRN: [id number] Account #: [id number] Date of Birth: 04-08-1966 Admit Type: Inpatient Age: [id number] [facility] Gender: Female Note Status: Finalized Procedure:            Upper GI endoscopy Indications:          Epigastric abdominal pain, Nausea with vomiting Providers:            Scot Junobert T. Lanora Reveron, MD Referring MD:         No Local Md, MD (Referring MD) Medicines:            Propofol per Anesthesia Complications:        No immediate complications. Procedure:            Pre-Anesthesia Assessment:                       - After reviewing the risks and benefits, the patient                        was deemed in satisfactory condition to undergo the                        procedure.                       After obtaining informed consent, the endoscope was                        passed under direct vision. Throughout the procedure,                        the patient's blood pressure, pulse, and oxygen                        saturations were monitored continuously. The Endoscope                        was introduced through the mouth, and advanced to the                        second part of duodenum. The upper GI endoscopy was                        accomplished without difficulty. The patient tolerated                        the procedure well. Findings:      One non-bleeding superficial gastric ulcer with no stigmata of bleeding       was found in the gastric antrum. The lesion was 2 mm in largest       dimension.      Patchy moderate inflammation characterized by erythema and friability       was found in the duodenal bulb.      The second portion of the duodenum was normal.      LA Grade B (one or more mucosal breaks greater than 5 mm, not extending       between the tops of two mucosal folds) esophagitis with no bleeding was       found 38 cm from the  incisors. Impression:           -  LA Grade A reflux esophagitis.                       - Non-bleeding gastric ulcer with no stigmata of                        bleeding.                       - Acute duodenitis.                       - Normal second portion of the duodenum.                       - No specimens collected. Recommendation:       - The findings and recommendations were discussed with                        the patient. Looks like some inflammation due to                        vomiting. would start with clear liquids and advance                        slowly to full liquids, continue carafate and PPI. Scot Jun, MD 09/05/2015 9:55:59 AM This report has been signed electronically. Number of Addenda: 0 Note Initiated On: 09/05/2015 9:30 AM      [facility]

## 2015-09-05 NOTE — Transfer of Care (Signed)
Immediate Anesthesia Transfer of Care Note  Patient: Sandra BachelorLinda K Cantrell  Procedure(s) Performed: Procedure(s): ESOPHAGOGASTRODUODENOSCOPY (EGD) (N/A)  Patient Location: PACU  Anesthesia Type:General  Level of Consciousness: sedated  Airway & Oxygen Therapy: Patient Spontanous Breathing and Patient connected to nasal cannula oxygen  Post-op Assessment: Report given to RN and Post -op Vital signs reviewed and stable  Post vital signs: Reviewed and stable  Last Vitals:  Filed Vitals:   09/04/15 2013 09/05/15 0426  BP: 97/52 93/53  Pulse: 54 57  Temp: 36.7 C 36.9 C  Resp: 18 20    Last Pain:  Filed Vitals:   09/05/15 0750  PainSc: 0-No pain         Complications: No apparent anesthesia complications

## 2015-09-05 NOTE — Progress Notes (Signed)
[facility] [place] at [facility]   PATIENT NAME: Sandra Cantrell    MR#:  [id number]  DATE OF BIRTH:  1966/02/08  SUBJECTIVE:  CHIEF COMPLAINT:   Chief Complaint  Patient presents with  . Abdominal Pain   - admitted with epigastric pain, some NSAID usage as outpatient, for EGD today - complains of insomnia   REVIEW OF SYSTEMS:  Review of Systems  Constitutional: Negative for fever and chills.  HENT: Negative for ear discharge, ear pain and nosebleeds.   Eyes: Negative for blurred vision and double vision.  Respiratory: Negative for cough, shortness of breath and wheezing.   Cardiovascular: Negative for chest pain and palpitations.  Gastrointestinal: Positive for heartburn, nausea and abdominal pain. Negative for vomiting, diarrhea and constipation.  Genitourinary: Negative for dysuria and urgency.  Musculoskeletal: Negative for myalgias and neck pain.  Neurological: Negative for dizziness, sensory change, speech change, focal weakness, seizures, weakness and headaches.  Psychiatric/Behavioral: Negative for depression. The patient has insomnia.     DRUG ALLERGIES:   Allergies  Allergen Reactions  . Penicillins Anaphylaxis    Has patient had a PCN reaction causing immediate rash, facial/tongue/throat swelling, SOB or lightheadedness with hypotension: Yes Has patient had a PCN reaction causing severe rash involving mucus membranes or skin necrosis: Yes Has patient had a PCN reaction that required hospitalization No Has patient had a PCN reaction occurring within the last 10 years: Yes If all of the above answers are "NO", then may proceed with Cephalosporin use.     VITALS:  Blood pressure 106/58, pulse 76, temperature 96.9 F (36.1 C), temperature source Tympanic, resp. rate 17, height  (1.651 m), weight 95.165 kg (209 lb 12.8 oz), last menstrual period 08/05/2015, SpO2 100 %.  PHYSICAL EXAMINATION:  Physical Exam  GENERAL: 50  y.o.-year-old patient lying in the bed with no acute distress.  EYES: Pupils equal, round, reactive to light and accommodation. No scleral icterus. Extraocular muscles intact.  HEENT: Head atraumatic, normocephalic. Oropharynx and nasopharynx clear.  NECK: Supple, no jugular venous distention. No thyroid enlargement, no tenderness.  LUNGS: Normal breath sounds bilaterally, no wheezing, rales,rhonchi or crepitation. No use of accessory muscles of respiration.  CARDIOVASCULAR: S1, S2 normal. No murmurs, rubs, or gallops.  ABDOMEN: Soft, Tender in the epigastric region and also periumbilical region, no guarding or rigidity, nondistended. Bowel sounds present. No organomegaly or mass.  EXTREMITIES: No pedal edema, cyanosis, or clubbing.  NEUROLOGIC: Cranial nerves II through XII are intact. Muscle strength 5/5 in all extremities. Sensation intact. Gait not checked.  PSYCHIATRIC: The patient is alert and oriented x 3.  SKIN: No obvious rash, lesion, or ulcer.    LABORATORY PANEL:   CBC  Recent Labs Lab 09/05/15 0056  WBC 12.1*  HGB 12.2  HCT 36.2  PLT 234   ------------------------------------------------------------------------------------------------------------------  Chemistries   Recent Labs Lab 09/04/15 0316 09/05/15 0056  NA 136 141  K 3.8 4.1  CL 99* 109  CO2 25 24  GLUCOSE 125* 111*  BUN 22* 15  CREATININE 1.08* 0.97  CALCIUM 9.9 8.1*  AST 24  --   ALT 32  --   ALKPHOS 73  --   BILITOT 0.4  --    ------------------------------------------------------------------------------------------------------------------  Cardiac Enzymes  Recent Labs Lab 09/04/15 1630  TROPONINI <0.03   ------------------------------------------------------------------------------------------------------------------  RADIOLOGY:  Ct Abdomen Pelvis W Contrast  09/04/2015  CLINICAL DATA:  Mid abdominal pain since yesterday. Nausea and vomiting. EXAM: CT ABDOMEN AND PELVIS  WITH  CONTRAST TECHNIQUE: Multidetector CT imaging of the abdomen and pelvis was performed using the standard protocol following bolus administration of intravenous contrast. CONTRAST:  100mL ISOVUE-300 IOPAMIDOL (ISOVUE-300) INJECTION 61% COMPARISON:  None. FINDINGS: The lung bases are clear.  Minimal esophageal hiatal hernia. Diffuse fatty infiltration of the liver. No focal liver lesions identified. The gallbladder, spleen, pancreas, adrenal glands, kidneys, abdominal aorta, inferior vena cava, and retroperitoneal lymph nodes are unremarkable. Stomach, small bowel, and colon are not abnormally distended. No free air or free fluid in the abdomen. Pelvis: The appendix is normal. Uterus and ovaries are not enlarged. No free or loculated pelvic fluid collections. No pelvic mass or lymphadenopathy. Bladder wall is not thickened. No destructive bone lesions. IMPRESSION: Diffuse fatty infiltration of the liver. No acute process demonstrated in the abdomen or pelvis. No evidence of bowel obstruction or inflammation. Electronically Signed   By: Burman NievesWilliam  Stevens M.D.   On: 09/04/2015 06:36    EKG:   Orders placed or performed during the hospital encounter of 09/04/15  . ED EKG  . ED EKG  . EKG 12-Lead  . EKG 12-Lead    ASSESSMENT AND PLAN:   Sandra Cantrell is a 50 y.o. female with a known history of Posttraumatic stress disorder, migraine and seasonal allergies presents to the hospital secondary to worsening abdominal pain that started last night.  #1 Acute gastritis: with gastritis,  esophagitis and duodenitis with a non bleeding gastric ulcer on EGD -Appreciate GI consult. EGD done today with the above findings. -Continue clear liquids today and advance to full liquids as tolerated. -Started Carafate. Protonix changed from drip to IV twice a day and can be changed over to oral twice a day tomorrow. -Hemoglobin is stable. -CT of the abdomen with no other acute findings.  #2 leukocytosis-stress  reaction likely from GI bleed and nausea vomiting. No evidence of infection noted so far. So hold off on antibiotics.  -Improving now.  #3 insomnia-restart Elavil at bedtime  #4 PTSD- patient suspects that her Wellbutrin giving her heartburn. So continue to hold it at this time.  #5 DVT prophylaxis-on Lovenox  #6 elevated troponin-no chest pain, no EKG changes. No prior cardiac history. -Likely demand ischemia. Repeat troponins are negative. -No further workup needed.   Possible discharge tomorrow.     All the records are reviewed and case discussed with Care Management/Social Workerr. Management plans discussed with the patient, family and they are in agreement.  CODE STATUS: Full Code  TOTAL TIME TAKING CARE OF THIS PATIENT: 36 minutes.   POSSIBLE D/C TOMORROW, DEPENDING ON CLINICAL CONDITION.   Enid BaasKALISETTI,Adams Hinch M.D on 09/05/2015 at 11:45 AM  Between 7am to 6pm - Pager - [phone]  After 6pm go to [email or web address] - password [email or web address] [facility]  [facility]  Office  [phone]  CC: Primary care physician; No PCP Per Patient

## 2015-09-05 NOTE — Anesthesia Postprocedure Evaluation (Signed)
Anesthesia Post Note  Patient: Keenan BachelorLinda K Boschee  Procedure(s) Performed: Procedure(s) (LRB): ESOPHAGOGASTRODUODENOSCOPY (EGD) (N/A)  Patient location during evaluation: Endoscopy Anesthesia Type: General Level of consciousness: awake and alert Pain management: pain level controlled Vital Signs Assessment: post-procedure vital signs reviewed and stable Respiratory status: spontaneous breathing, nonlabored ventilation, respiratory function stable and patient connected to nasal cannula oxygen Cardiovascular status: blood pressure returned to baseline and stable Postop Assessment: no signs of nausea or vomiting Anesthetic complications: no    Last Vitals:  Filed Vitals:   09/05/15 1020 09/05/15 1030  BP: 106/58 106/58  Pulse: 76   Temp:    Resp: 17     Last Pain:  Filed Vitals:   09/05/15 1033  PainSc: 0-No pain                 Cleda MccreedyJoseph K Piscitello

## 2015-09-06 MED ORDER — FLUOXETINE HCL 20 MG PO CAPS: 20.0000 mg | ORAL_CAPSULE | Freq: Every day | ORAL | Status: DC

## 2015-09-06 MED ORDER — SUCRALFATE 1 GM/10ML PO SUSP: 1.0000 g | Freq: Three times a day (TID) | ORAL | Status: DC

## 2015-09-06 MED ORDER — PANTOPRAZOLE SODIUM 40 MG PO TBEC: 40.0000 mg | DELAYED_RELEASE_TABLET | Freq: Two times a day (BID) | ORAL | Status: DC

## 2015-09-06 NOTE — Discharge Instructions (Signed)
Abdominal Pain, Adult Many things can cause abdominal pain. Usually, abdominal pain is not caused by a disease and will improve without treatment. It can often be observed and treated at home. Your health care provider will do a physical exam and possibly order blood tests and X-rays to help determine the seriousness of your pain. However, in many cases, more time must pass before a clear cause of the pain can be found. Before that point, your health care provider may not know if you need more testing or further treatment. HOME CARE INSTRUCTIONS Monitor your abdominal pain for any changes. The following actions may help to alleviate any discomfort you are experiencing:  Only take over-the-counter or prescription medicines as directed by your health care provider.  Do not take laxatives unless directed to do so by your health care provider.  Try a clear liquid diet (broth, tea, or water) as directed by your health care provider. Slowly move to a bland diet as tolerated. SEEK MEDICAL CARE IF:  You have unexplained abdominal pain.  You have abdominal pain associated with nausea or diarrhea.  You have pain when you urinate or have a bowel movement.  You experience abdominal pain that wakes you in the night.  You have abdominal pain that is worsened or improved by eating food.  You have abdominal pain that is worsened with eating fatty foods.  You have a fever. SEEK IMMEDIATE MEDICAL CARE IF:  Your pain does not go away within 2 hours.  You keep throwing up (vomiting).  Your pain is felt only in portions of the abdomen, such as the right side or the left lower portion of the abdomen.  You pass bloody or black tarry stools. MAKE SURE YOU:  Understand these instructions.  Will watch your condition.  Will get help right away if you are not doing well or get worse.   This information is not intended to replace advice given to you by your health care provider. Make sure you discuss  any questions you have with your health care provider.   Document Released: 01/27/2005 Document Revised: 01/08/2015 Document Reviewed: 12/27/2012 Elsevier Interactive Patient Education 2016 [employer]  Esophagitis Esophagitis is inflammation of the esophagus. The esophagus is the tube that carries food and liquids from your mouth to your stomach. Esophagitis can cause soreness or pain in the esophagus. This condition can make it difficult and painful to swallow.  CAUSES Most causes of esophagitis are not serious. Common causes of this condition include:  Gastroesophageal reflux disease (GERD). This is when stomach contents move back up into the esophagus (reflux).  Repeated vomiting.  An allergic-type reaction, especially caused by food allergies (eosinophilic esophagitis).  Injury to the esophagus by swallowing large pills with or without water, or swallowing certain types of medicines.  Swallowing (ingesting) harmful chemicals, such as household cleaning products.  Heavy alcohol use.  An infection of the esophagus.This most often occurs in people who have a weakened immune system.  Radiation or chemotherapy treatment for cancer.  Certain diseases such as sarcoidosis, Crohn disease, and scleroderma. SYMPTOMS Symptoms of this condition include:  Difficult or painful swallowing.  Pain with swallowing acidic liquids, such as citrus juices.  Pain with burping.  Chest pain.  Difficulty breathing.  Nausea.  Vomiting.  Pain in the abdomen.  Weight loss.  Ulcers in the mouth.  Patches of white material in the mouth (candidiasis).  Fever.  Coughing up blood or vomiting blood.  Stool that is black, tarry, or  bright red. DIAGNOSIS Your health care provider will take a medical history and perform a physical exam. You may also have other tests, including:  An endoscopy to examine your stomach and esophagus with a small camera.  A test that measures the acidity  level in your esophagus.  A test that measures how much pressure is on your esophagus.  A barium swallow or modified barium swallow to show the shape, size, and functioning of your esophagus.  Allergy tests. TREATMENT Treatment for this condition depends on the cause of your esophagitis. In some cases, steroids or other medicines may be given to help relieve your symptoms or to treat the underlying cause of your condition. You may have to make some lifestyle changes, such as:  Avoiding alcohol.  Quitting smoking.  Changing your diet.  Exercising.  Changing your sleep habits and your sleep environment. HOME CARE INSTRUCTIONS Take these actions to decrease your discomfort and to help avoid complications. Diet  Follow a diet as recommended by your health care provider. This may involve avoiding foods and drinks such as:  Coffee and tea (with or without caffeine).  Drinks that contain alcohol.  Energy drinks and sports drinks.  Carbonated drinks or sodas.  Chocolate and cocoa.  Peppermint and mint flavorings.  Garlic and onions.  Horseradish.  Spicy and acidic foods, including peppers, chili powder, curry powder, vinegar, hot sauces, and barbecue sauce.  Citrus fruit juices and citrus fruits, such as oranges, lemons, and limes.  Tomato-based foods, such as red sauce, chili, salsa, and pizza with red sauce.  Fried and fatty foods, such as donuts, french fries, potato chips, and high-fat dressings.  High-fat meats, such as hot dogs and fatty cuts of red and white meats, such as rib eye steak, sausage, ham, and bacon.  High-fat dairy items, such as whole milk, butter, and cream cheese.  Eat small, frequent meals instead of large meals.  Avoid drinking large amounts of liquid with your meals.  Avoid eating meals during the 2-3 hours before bedtime.  Avoid lying down right after you eat.  Do not exercise right after you eat.  Avoid foods and drinks that seem to  make your symptoms worse. General Instructions  Pay attention to any changes in your symptoms.  Take over-the-counter and prescription medicines only as told by your health care provider. Do not take aspirin, ibuprofen, or other NSAIDs unless your health care provider told you to do so.  If you have trouble taking pills, use a pill splitter to decrease the size of the pill. This will decrease the chance of the pill getting stuck or injuring your esophagus on the way down. Also, drink water after you take a pill.  Do not use any tobacco products, including cigarettes, chewing tobacco, and e-cigarettes. If you need help quitting, ask your health care provider.  Wear loose-fitting clothing. Do not wear anything tight around your waist that causes pressure on your abdomen.  Raise (elevate) the head of your bed about 6 inches (15 cm).  Try to reduce your stress, such as with yoga or meditation. If you need help reducing stress, ask your health care provider.  If you are overweight, reduce your weight to an amount that is healthy for you. Ask your health care provider for guidance about a safe weight loss goal.  Keep all follow-up visits as told by your health care provider. This is important. SEEK MEDICAL CARE IF:  You have new symptoms.  You have unexplained weight loss.  You have difficulty swallowing, or it hurts to swallow.  You have wheezing or a persistent cough.  Your symptoms do not improve with treatment.  You have frequent heartburn for more than two weeks. SEEK IMMEDIATE MEDICAL CARE IF:  You have severe pain in your arms, neck, jaw, teeth, or back.  You feel sweaty, dizzy, or light-headed.  You have chest pain or shortness of breath.  You vomit and your vomit looks like blood or coffee grounds.  Your stool is bloody or black.  You have a fever.  You cannot swallow, drink, or eat.   This information is not intended to replace advice given to you by your health  care provider. Make sure you discuss any questions you have with your health care provider.   Document Released: 05/27/2004 Document Revised: 01/08/2015 Document Reviewed: 08/14/2014 Elsevier Interactive Patient Education 2016 [employer] Inc[employer] Gastritis, Adult Gastritis is soreness and swelling (inflammation) of the lining of the stomach. Gastritis can develop as a sudden onset (acute) or long-term (chronic) condition. If gastritis is not treated, it can lead to stomach bleeding and ulcers. CAUSES  Gastritis occurs when the stomach lining is weak or damaged. Digestive juices from the stomach then inflame the weakened stomach lining. The stomach lining may be weak or damaged due to viral or bacterial infections. One common bacterial infection is the Helicobacter pylori infection. Gastritis can also result from excessive alcohol consumption, taking certain medicines, or having too much acid in the stomach.  SYMPTOMS  In some cases, there are no symptoms. When symptoms are present, they may include:  Pain or a burning sensation in the upper abdomen.  Nausea.  Vomiting.  An uncomfortable feeling of fullness after eating. DIAGNOSIS  Your caregiver may suspect you have gastritis based on your symptoms and a physical exam. To determine the cause of your gastritis, your caregiver may perform the following:  Blood or stool tests to check for the H pylori bacterium.  Gastroscopy. A thin, flexible tube (endoscope) is passed down the esophagus and into the stomach. The endoscope has a light and camera on the end. Your caregiver uses the endoscope to view the inside of the stomach.  Taking a tissue sample (biopsy) from the stomach to examine under a microscope. TREATMENT  Depending on the cause of your gastritis, medicines may be prescribed. If you have a bacterial infection, such as an H pylori infection, antibiotics may be given. If your gastritis is caused by too much acid in the stomach, H2 blockers  or antacids may be given. Your caregiver may recommend that you stop taking aspirin, ibuprofen, or other nonsteroidal anti-inflammatory drugs (NSAIDs). HOME CARE INSTRUCTIONS  Only take over-the-counter or prescription medicines as directed by your caregiver.  If you were given antibiotic medicines, take them as directed. Finish them even if you start to feel better.  Drink enough fluids to keep your urine clear or pale yellow.  Avoid foods and drinks that make your symptoms worse, such as:  Caffeine or alcoholic drinks.  Chocolate.  Peppermint or mint flavorings.  Garlic and onions.  Spicy foods.  Citrus fruits, such as oranges, lemons, or limes.  Tomato-based foods such as sauce, chili, salsa, and pizza.  Fried and fatty foods.  Eat small, frequent meals instead of large meals. SEEK IMMEDIATE MEDICAL CARE IF:   You have black or dark red stools.  You vomit blood or material that looks like coffee grounds.  You are unable to keep fluids down.  Your abdominal pain  gets worse.  You have a fever.  You do not feel better after 1 week.  You have any other questions or concerns. MAKE SURE YOU:  Understand these instructions.  Will watch your condition.  Will get help right away if you are not doing well or get worse.   This information is not intended to replace advice given to you by your health care provider. Make sure you discuss any questions you have with your health care provider.   Document Released: 04/13/2001 Document Revised: 10/19/2011 Document Reviewed: 06/02/2011 Elsevier Interactive Patient Education 2016 [employer]. Food Choices to Help Relieve Diarrhea, Adult When you have diarrhea, the foods you eat and your eating habits are very important. Choosing the right foods and drinks can help relieve diarrhea. Also, because diarrhea can last up to 7 days, you need to replace lost fluids and electrolytes (such as sodium, potassium, and chloride) in order  to help prevent dehydration.  WHAT GENERAL GUIDELINES DO I NEED TO FOLLOW?  Slowly drink 1 cup (8 oz) of fluid for each episode of diarrhea. If you are getting enough fluid, your urine will be clear or pale yellow.  Eat starchy foods. Some good choices include white rice, white toast, pasta, low-fiber cereal, baked potatoes (without the skin), saltine crackers, and bagels.  Avoid large servings of any cooked vegetables.  Limit fruit to two servings per day. A serving is  cup or 1 small piece.  Choose foods with less than 2 g of fiber per serving.  Limit fats to less than 8 tsp (38 g) per day.  Avoid fried foods.  Eat foods that have probiotics in them. Probiotics can be found in certain dairy products.  Avoid foods and beverages that may increase the speed at which food moves through the stomach and intestines (gastrointestinal tract). Things to avoid include:  High-fiber foods, such as dried fruit, raw fruits and vegetables, nuts, seeds, and whole grain foods.  Spicy foods and high-fat foods.  Foods and beverages sweetened with high-fructose corn syrup, honey, or sugar alcohols such as xylitol, sorbitol, and mannitol. WHAT FOODS ARE RECOMMENDED? Grains White rice. White, [place], or pita breads (fresh or toasted), including plain rolls, buns, or bagels. White pasta. Saltine, soda, or graham crackers. Pretzels. Low-fiber cereal. Cooked cereals made with water (such as cornmeal, farina, or cream cereals). Plain muffins. Matzo. Melba toast. Zwieback.  Vegetables Potatoes (without the skin). Strained tomato and vegetable juices. Most well-cooked and canned vegetables without seeds. Tender lettuce. Fruits Cooked or canned applesauce, apricots, cherries, fruit cocktail, grapefruit, peaches, pears, or plums. Fresh bananas, apples without skin, cherries, grapes, cantaloupe, grapefruit, peaches, oranges, or plums.  Meat and Other Protein Products Baked or boiled chicken. Eggs. Tofu. Fish.  Seafood. Smooth peanut butter. Ground or well-cooked tender beef, ham, veal, lamb, pork, or poultry.  Dairy Plain yogurt, kefir, and unsweetened liquid yogurt. Lactose-free milk, buttermilk, or soy milk. Plain hard cheese. Beverages Sport drinks. Clear broths. Diluted fruit juices (except prune). Regular, caffeine-free sodas such as ginger ale. Water. Decaffeinated teas. Oral rehydration solutions. Sugar-free beverages not sweetened with sugar alcohols. Other Bouillon, broth, or soups made from recommended foods.  The items listed above may not be a complete list of recommended foods or beverages. Contact your dietitian for more options. WHAT FOODS ARE NOT RECOMMENDED? Grains Whole grain, whole wheat, bran, or rye breads, rolls, pastas, crackers, and cereals. Wild or brown rice. Cereals that contain more than 2 g of fiber per serving. Corn tortillas or  taco shells. Cooked or dry oatmeal. Granola. Popcorn. Vegetables Raw vegetables. Cabbage, broccoli, Brussels sprouts, artichokes, baked beans, beet greens, corn, kale, legumes, peas, sweet potatoes, and yams. Potato skins. Cooked spinach and cabbage. Fruits Dried fruit, including raisins and dates. Raw fruits. Stewed or dried prunes. Fresh apples with skin, apricots, mangoes, pears, raspberries, and strawberries.  Meat and Other Protein Products Chunky peanut butter. Nuts and seeds. Beans and lentils. Tomasa Blase.  Dairy High-fat cheeses. Milk, chocolate milk, and beverages made with milk, such as milk shakes. Cream. Ice cream. Sweets and Desserts Sweet rolls, doughnuts, and sweet breads. Pancakes and waffles. Fats and Oils Butter. Cream sauces. Margarine. Salad oils. Plain salad dressings. Olives. Avocados.  Beverages Caffeinated beverages (such as coffee, tea, soda, or energy drinks). Alcoholic beverages. Fruit juices with pulp. Prune juice. Soft drinks sweetened with high-fructose corn syrup or sugar alcohols. Other Coconut. Hot sauce. Chili  powder. Mayonnaise. Gravy. Cream-based or milk-based soups.  The items listed above may not be a complete list of foods and beverages to avoid. Contact your dietitian for more information. WHAT SHOULD I DO IF I BECOME DEHYDRATED? Diarrhea can sometimes lead to dehydration. Signs of dehydration include dark urine and dry mouth and skin. If you think you are dehydrated, you should rehydrate with an oral rehydration solution. These solutions can be purchased at pharmacies, retail stores, or online.  Drink -1 cup (120-240 mL) of oral rehydration solution each time you have an episode of diarrhea. If drinking this amount makes your diarrhea worse, try drinking smaller amounts more often. For example, drink 1-3 tsp (5-15 mL) every 5-10 minutes.  A general rule for staying hydrated is to drink 1-2 L of fluid per day. Talk to your health care provider about the specific amount you should be drinking each day. Drink enough fluids to keep your urine clear or pale yellow.   This information is not intended to replace advice given to you by your health care provider. Make sure you discuss any questions you have with your health care provider.   Document Released: 07/10/2003 Document Revised: 05/10/2014 Document Reviewed: 03/12/2013 Elsevier Interactive Patient Education 2016 Else[employer] Inc. Soft-Food Meal Plan A soft-food meal plan includes foods that are safe and easy to swallow. This meal plan typically is used:  If you are having trouble chewing or swallowing foods.  As a transition meal plan after only having had liquid meals for a long period. WHAT DO I NEED TO KNOW ABOUT THE SOFT-FOOD MEAL PLAN? A soft-food meal plan includes tender foods that are soft and easy to chew and swallow. In most cases, bite-sized pieces of food are easier to swallow. A bite-sized piece is about  inch or smaller. Foods in this plan do not need to be ground or pureed. Foods that are very hard, crunchy, or sticky should be  avoided. Also, breads, cereals, yogurts, and desserts with nuts, seeds, or fruits should be avoided. WHAT FOODS CAN I EAT? Grains Rice and wild rice. Moist bread, dressing, pasta, and noodles. Well-moistened dry or cooked cereals, such as farina (cooked wheat cereal), oatmeal, or grits. Biscuits, breads, muffins, pancakes, and waffles that have been well moistened. Vegetables Shredded lettuce. Cooked, tender vegetables, including potatoes without skins. Vegetable juices. Broths or creamed soups made with vegetables that are not stringy or chewy. Strained tomatoes (without seeds). Fruits Canned or well-cooked fruits. Soft (ripe), peeled fresh fruits, such as peaches, nectarines, kiwi, cantaloupe, honeydew melon, and watermelon (without seeds). Soft berries with small seeds, such as  strawberries. Fruit juices (without pulp). Meats and Other Protein Sources Moist, tender, lean beef. Mutton. Lamb. Veal. Chicken. [place]. Liver. Ham. Fish without bones. Eggs. Dairy Milk, milk drinks, and cream. Plain cream cheese and cottage cheese. Plain yogurt. Sweets/Desserts Flavored gelatin desserts. Custard. Plain ice cream, frozen yogurt, sherbet, milk shakes, and malts. Plain cakes and cookies. Plain hard candy.  Other Butter, margarine (without trans fat), and cooking oils. Mayonnaise. Cream sauces. Mild spices, salt, and sugar. Syrup, molasses, honey, and jelly. The items listed above may not be a complete list of recommended foods or beverages. Contact your dietitian for more options. WHAT FOODS ARE NOT RECOMMENDED? Grains Dry bread, toast, crackers that have not been moistened. Coarse or dry cereals, such as bran, granola, and shredded wheat. Tough or chewy crusty breads, such as [place] bread or baguettes. Vegetables Corn. Raw vegetables except shredded lettuce. Cooked vegetables that are tough or stringy. Tough, crisp, fried potatoes and potato skins. Fruits Fresh fruits with skins or seeds or both,  such as apples, pears, or grapes. Stringy, high-pulp fruits, such as papaya, pineapple, coconut, or mango. Fruit leather, fruit roll-ups, and all dried fruits. Meats and Other Protein Sources Sausages and hot dogs. Meats with gristle. Fish with bones. Nuts, seeds, and chunky peanut or other nut butters. Sweets/Desserts Cakes or cookies that are very dry or chewy.  The items listed above may not be a complete list of foods and beverages to avoid. Contact your dietitian for more information.   This information is not intended to replace advice given to you by your health care provider. Make sure you discuss any questions you have with your health care provider.   Document Released: 07/27/2007 Document Revised: 04/24/2013 Document Reviewed: 03/16/2013 Elsevier Interactive Patient Education [employer]

## 2015-09-06 NOTE — Discharge Summary (Signed)
[employer] [facility] [place] at [facility]   PATIENT NAME: Sandra Cantrell    MR#:  [id number]  DATE OF BIRTH:  Feb 25, 1966  DATE OF ADMISSION:  09/04/2015 ADMITTING PHYSICIAN: Enid Baas, MD  DATE OF DISCHARGE: 09/06/2015  2:54 PM  PRIMARY CARE PHYSICIAN: No PCP Per Patient    ADMISSION DIAGNOSIS:  Elevated troponin I level [R79.89] Abdominal pain, unspecified abdominal location [R10.9] Non-intractable vomiting with nausea, vomiting of unspecified type [R11.2] Gastroesophageal reflux disease, esophagitis presence not specified [K21.9]  DISCHARGE DIAGNOSIS:  Active Problems:   Abdominal pain   Post traumatic stress disorder (PTSD)   PTSD (post-traumatic stress disorder)   SECONDARY DIAGNOSIS:   Past Medical History  Diagnosis Date  . Post traumatic stress disorder (PTSD)   . Anxiety   . Depression   . Stress headaches   . Multiple allergies     HOSPITAL COURSE:   Sandra Cantrell is a 50 y.o. female with a known history of Posttraumatic stress disorder, migraine and seasonal allergies presents to the hospital secondary to worsening abdominal pain that started last night.  #1 abdominal pain- this was thought to be secondary to acute gastritis. -Patient was started on IV tonics drip. A gastroenterology consult was obtained. Patient underwent an upper GI endoscopy which showed gastritis/esophagitis/non- bleeding gastric ulcer. -Post EGD patient's diet has been slowly advanced to a soft diet which she is tolerating without any further exacerbation of her abdominal pain. She denies any nausea vomiting. -She is being discharged on Protonix twice daily along with Carafate and follow up with gastroenterology as an outpatient. -Patient CT scan of the abdomen and pelvis on admission showed no acute pathology.  #2 leukocytosis-stress reaction likely from GI bleed and nausea vomiting. No evidence of infection noted so far.  -Resolved.  #3 insomnia- cont. Elavil  #4  PTSD- patient suspects that her Wellbutrin giving her heartburn.  -A psychiatric consult was obtained and patient was taken off the Wellbutrin. She is not being discharged on oral Prozac.  #5 elevated troponin- she had no chest pain, no EKG changes. No prior cardiac history. - this was demand ischemia and she was asymptomatic upon discharge.   DISCHARGE CONDITIONS:   Stable  CONSULTS OBTAINED:  Treatment Team:  Christena Deem, MD Audery Amel, MD  DRUG ALLERGIES:   Allergies  Allergen Reactions  . Penicillins Anaphylaxis    Has patient had a PCN reaction causing immediate rash, facial/tongue/throat swelling, SOB or lightheadedness with hypotension: Yes Has patient had a PCN reaction causing severe rash involving mucus membranes or skin necrosis: Yes Has patient had a PCN reaction that required hospitalization No Has patient had a PCN reaction occurring within the last 10 years: Yes If all of the above answers are "NO", then may proceed with Cephalosporin use.     DISCHARGE MEDICATIONS:   Discharge Medication List as of 09/06/2015  1:45 PM    START taking these medications   Details  FLUoxetine (PROZAC) 20 MG capsule Take 1 capsule (20 mg total) by mouth daily., Starting 09/06/2015, Until Discontinued, Print    pantoprazole (PROTONIX) 40 MG tablet Take 1 tablet (40 mg total) by mouth 2 (two) times daily., Starting 09/06/2015, Until Discontinued, Print    sucralfate (CARAFATE) 1 GM/10ML suspension Take 10 mLs (1 g total) by mouth 4 (four) times daily -  with meals and at bedtime., Starting 09/06/2015, Until Discontinued, Print      CONTINUE these medications which have NOT CHANGED   Details  aspirin-acetaminophen-caffeine (  EXCEDRIN MIGRAINE) 250-250-65 MG tablet Take 1 tablet by mouth every 6 (six) hours as needed for headache., Until Discontinued, Historical Med    Brexpiprazole 1 MG TABS Take 2 tablets by mouth daily., Until Discontinued, Historical Med    calcium  carbonate (TUMS - DOSED IN MG ELEMENTAL CALCIUM) 500 MG chewable tablet Chew 1 tablet by mouth as needed for indigestion or heartburn., Until Discontinued, Historical Med      STOP taking these medications     buPROPion (WELLBUTRIN SR) 150 MG 12 hr tablet      HYDROcodone-acetaminophen (NORCO/VICODIN) 5-325 MG tablet          DISCHARGE INSTRUCTIONS:   DIET:  Regular diet  DISCHARGE CONDITION:  Stable  ACTIVITY:  Activity as tolerated  OXYGEN:  Home Oxygen: No.   Oxygen Delivery: room air  DISCHARGE LOCATION:  home   If you experience worsening of your admission symptoms, develop shortness of breath, life threatening emergency, suicidal or homicidal thoughts you must seek medical attention immediately by calling 911 or calling your MD immediately  if symptoms less severe.  You Must read complete instructions/literature along with all the possible adverse reactions/side effects for all the Medicines you take and that have been prescribed to you. Take any new Medicines after you have completely understood and accpet all the possible adverse reactions/side effects.   Please note  You were cared for by a hospitalist during your hospital stay. If you have any questions about your discharge medications or the care you received while you were in the hospital after you are discharged, you can call the unit and asked to speak with the hospitalist on call if the hospitalist that took care of you is not available. Once you are discharged, your primary care physician will handle any further medical issues. Please note that NO REFILLS for any discharge medications will be authorized once you are discharged, as it is imperative that you return to your primary care physician (or establish a relationship with a primary care physician if you do not have one) for your aftercare needs so that they can reassess your need for medications and monitor your lab values.     Today   Tolerating  liquid diet well. Hemoglobin stable. No nausea, vomiting.  VITAL SIGNS:  Blood pressure 105/56, pulse 56, temperature 98.4 F (36.9 C), temperature source Oral, resp. rate 18, height 5\' 5"  (1.651 m), weight 95.165 kg (209 lb 12.8 oz), last menstrual period 08/05/2015, SpO2 93 %.  I/O:   Intake/Output Summary (Last 24 hours) at 09/06/15 1615 Last data filed at 09/06/15 1130  Gross per 24 hour  Intake   1000 ml  Output      0 ml  Net   1000 ml    PHYSICAL EXAMINATION:  GENERAL:  50 y.o.-year-old patient lying in the bed with no acute distress.  EYES: Pupils equal, round, reactive to light and accommodation. No scleral icterus. Extraocular muscles intact.  HEENT: Head atraumatic, normocephalic. Oropharynx and nasopharynx clear.  NECK:  Supple, no jugular venous distention. No thyroid enlargement, no tenderness.  LUNGS: Normal breath sounds bilaterally, no wheezing, rales,rhonchi. No use of accessory muscles of respiration.  CARDIOVASCULAR: S1, S2 normal. No murmurs, rubs, or gallops.  ABDOMEN: Soft, non-tender, non-distended. Bowel sounds present. No organomegaly or mass.  EXTREMITIES: No pedal edema, cyanosis, or clubbing.  NEUROLOGIC: Cranial nerves II through XII are intact. No focal motor or sensory defecits b/l.  PSYCHIATRIC: The patient is alert and oriented x 3.  Good affect.  SKIN: No obvious rash, lesion, or ulcer.   DATA REVIEW:   CBC  Recent Labs Lab 09/05/15 0056  WBC 12.1*  HGB 12.2  HCT 36.2  PLT 234    Chemistries   Recent Labs Lab 09/04/15 0316 09/05/15 0056  NA 136 141  K 3.8 4.1  CL 99* 109  CO2 25 24  GLUCOSE 125* 111*  BUN 22* 15  CREATININE 1.08* 0.97  CALCIUM 9.9 8.1*  AST 24  --   ALT 32  --   ALKPHOS 73  --   BILITOT 0.4  --     Cardiac Enzymes  Recent Labs Lab 09/04/15 1630  TROPONINI <0.03     RADIOLOGY:  No results found.    Management plans discussed with the patient, family and they are in agreement.  CODE STATUS:      Code Status Orders        Start     Ordered   09/04/15 1047  Full code   Continuous     09/04/15 1046    Code Status History    Date Active Date Inactive Code Status Order ID Comments User Context   This patient has a current code status but no historical code status.      TOTAL TIME TAKING CARE OF THIS PATIENT: 40 minutes.    Houston Siren M.D on 09/06/2015 at 4:15 PM  Between 7am to 6pm - Pager - [phone]  After 6pm go to [email or web address] - password [email or web address] [facility]  [facility] [place]  Office  [phone]  CC: Primary care physician; No PCP Per Patient

## 2015-09-06 NOTE — Consult Note (Signed)
GI Inpatient Follow-up Note  Patient Identification: Sandra Cantrell is a 50 y.o. female with upper GI bleed  Subjective: Is doing much better today.  She had an upper endoscopy completed on May 5 with Dr. Mechele CollinElliott.  Findings included one nonbleeding superficial gastric ulcer with no stigmata of bleeding was found in the gastric antrum.  The lesion was 2 mm in largest dimension.  LA grade a reflux esophagitis.  Acute duodenitis.  Normal second portion of the duodenum.  She reports she has been still experiencing some epigastric tenderness along with discomfort in her throat.  She tolerated full liquids at breakfast.  She does report a bowel movement this morning, unremarkable.  Scheduled Inpatient Medications:  . amitriptyline  25 mg Oral QHS  . enoxaparin (LOVENOX) injection  40 mg Subcutaneous Q24H  . FLUoxetine  20 mg Oral Daily  . pantoprazole (PROTONIX) IV  40 mg Intravenous Q12H  . sucralfate  1 g Oral TID WC & HS    Continuous Inpatient Infusions:   . sodium chloride 100 mL/hr at 09/06/15 0958    PRN Inpatient Medications:  acetaminophen **OR** acetaminophen, HYDROcodone-acetaminophen, morphine injection, ondansetron **OR** ondansetron (ZOFRAN) IV  Review of Systems: Constitutional: Weight is stable.  Eyes: No changes in vision. ENT: No oral lesions, sore throat.  GI: see HPI.  Heme/Lymph: No easy bruising.  CV: No chest pain.  GU: No hematuria.  Integumentary: No rashes.  Neuro: No headaches.  Psych: No depression/anxiety.  Endocrine: No heat/cold intolerance.  Allergic/Immunologic: No urticaria.  Resp: No cough, SOB.  Musculoskeletal: No joint swelling.    Physical Examination: BP 105/56 mmHg  Pulse 56  Temp(Src) 98.4 F (36.9 C) (Oral)  Resp 18  Ht 5\' 5"  (1.651 m)  Wt 95.165 kg (209 lb 12.8 oz)  BMI 34.91 kg/m2  SpO2 93%  LMP 08/05/2015 (Exact Date) Gen: NAD, alert and oriented x 4 HEENT: PEERLA, EOMI, Neck: supple, no JVD or thyromegaly Chest: CTA  bilaterally, no wheezes, crackles, or other adventitious sounds CV: RRR, no m/g/c/r Abd: soft,slight epigastric tenderness, ND, +BS in all four quadrants; no HSM, guarding, ridigity, or rebound tenderness Ext: no edema, well perfused with 2+ pulses, Skin: no rash or lesions noted Lymph: no LAD  Data: Lab Results  Component Value Date   WBC 12.1* 09/05/2015   HGB 12.2 09/05/2015   HCT 36.2 09/05/2015   MCV 92.8 09/05/2015   PLT 234 09/05/2015    Recent Labs Lab 09/04/15 0723 09/04/15 1652 09/05/15 0056  HGB 13.1 13.1 12.2   Lab Results  Component Value Date   NA 141 09/05/2015   K 4.1 09/05/2015   CL 109 09/05/2015   CO2 24 09/05/2015   BUN 15 09/05/2015   CREATININE 0.97 09/05/2015   Lab Results  Component Value Date   ALT 32 09/04/2015   AST 24 09/04/2015   ALKPHOS 73 09/04/2015   BILITOT 0.4 09/04/2015   No results for input(s): APTT, INR, PTT in the last 168 hours.  Assessment/Plan: [name] is a 50 y.o. female with upper GI bleed. BUN/creatinine are improved, back within normal limits.  White blood cell count improved to 12.1.  H pylori still pending.  Recommendations: We recommend that as long as she is able to tolerate soft food at lunch she is fine for discharge.  Strongly recommend discharge on Protonix oral 40 mg twice daily, Carafate 3 times daily and follow-up in our office at the end of the week.  Continue to avoid all NSAIDs.  Please call with questions or concerns.  Carney Harder, PA-C  I personally performed these services.

## 2015-09-06 NOTE — Consult Note (Signed)
Patient seen by Dr. Toni Amendlapacs and recommendations noted.  Pt to go on Prozac generic and PPI generic as she has no insurance.  I asked her to follow up in office in a few weeks and to take ulcer medicine for at least 3 months to heal up ulcer.  Should eat soft diet.

## 2015-09-06 NOTE — Care Management Note (Signed)
Case Management Note  Patient Details  Name: Sandra BachelorLinda K Cantrell MRN: [id number] Date of Birth: 20-Mar-1966  Subjective/Objective:      [facility] Program would not accept [name] because it reported that she has a PSG Medication card. [name] reports that she does have this card. This Clinical research associatewriter provided [name] with GoodRx coupons for Prozac, Carafate, and Protonix. No other home health services needed.             Action/Plan:   Expected Discharge Date:                  Expected Discharge Plan:     In-House Referral:     Discharge planning Services     Post Acute Care Choice:    Choice offered to:     DME Arranged:    DME Agency:     HH Arranged:    HH Agency:     Status of Service:     Medicare Important Message Given:    Date Medicare IM Given:    Medicare IM give by:    Date Additional Medicare IM Given:    Additional Medicare Important Message give by:     If discussed at Long Length of Stay Meetings, dates discussed:    Additional Comments:  [name] A, RN 09/06/2015, 11:58 AM

## 2015-09-06 NOTE — Progress Notes (Signed)
Patient discharged via wheelchair and private vehicle. IV removed and catheter intact. All discharge instructions given and patient verbalizes understanding. Tele removed and returned. Prescriptions given to patient No distress noted.   

## 2015-09-07 ENCOUNTER — Encounter: Payer: Self-pay | Admitting: Unknown Physician Specialty

## 2015-09-09 LAB — H PYLORI, IGM, IGG, IGA AB
H Pylori IgG: <0.9 U/mL (ref 0.0–0.8)
H. Pylogi, Iga Abs: <9 units (ref 0.0–8.9)
H. Pylogi, Igm Abs: <9 units (ref 0.0–8.9)

## 2015-10-12 ENCOUNTER — Emergency Department: Payer: Self-pay

## 2015-10-12 ENCOUNTER — Emergency Department
Admission: EM | Admit: 2015-10-12 | Discharge: 2015-10-12 | Disposition: A | Discharge status: Home / Self Care | Payer: Self-pay | Attending: Emergency Medicine | Admitting: Emergency Medicine

## 2015-10-12 ENCOUNTER — Encounter: Payer: Self-pay | Admitting: Emergency Medicine

## 2015-10-12 DIAGNOSIS — Z79899 Other long term (current) drug therapy: Secondary | ICD-10-CM | POA: Insufficient documentation

## 2015-10-12 DIAGNOSIS — F17210 Nicotine dependence, cigarettes, uncomplicated: Secondary | ICD-10-CM | POA: Insufficient documentation

## 2015-10-12 DIAGNOSIS — R112 Nausea with vomiting, unspecified: Secondary | ICD-10-CM | POA: Insufficient documentation

## 2015-10-12 DIAGNOSIS — Z7982 Long term (current) use of aspirin: Secondary | ICD-10-CM | POA: Insufficient documentation

## 2015-10-12 DIAGNOSIS — F329 Major depressive disorder, single episode, unspecified: Secondary | ICD-10-CM | POA: Insufficient documentation

## 2015-10-12 LAB — URINALYSIS COMPLETE WITH MICROSCOPIC (ARMC ONLY)
Bilirubin Urine: NEGATIVE
Glucose, UA: NEGATIVE mg/dL
Ketones, ur: NEGATIVE mg/dL
LEUKOCYTES UA: NEGATIVE
Nitrite: NEGATIVE
PH: 5 (ref 5.0–8.0)
PROTEIN: 30 mg/dL — AB
Specific Gravity, Urine: 1.027 (ref 1.005–1.030)

## 2015-10-12 LAB — COMPREHENSIVE METABOLIC PANEL
ALBUMIN: 4.1 g/dL (ref 3.5–5.0)
ALK PHOS: 83 U/L (ref 38–126)
ALT: 33 U/L (ref 14–54)
AST: 28 U/L (ref 15–41)
Anion gap: 8 (ref 5–15)
BUN: 11 mg/dL (ref 6–20)
CHLORIDE: 103 mmol/L (ref 101–111)
CO2: 27 mmol/L (ref 22–32)
Calcium: 9.2 mg/dL (ref 8.9–10.3)
Creatinine, Ser: 1.16 mg/dL — ABNORMAL HIGH (ref 0.44–1.00)
GFR calc Af Amer: >60 mL/min (ref >60)
GFR, EST NON AFRICAN AMERICAN: 55 mL/min — AB (ref >60)
Glucose, Bld: 121 mg/dL — ABNORMAL HIGH (ref 65–99)
Potassium: 3.8 mmol/L (ref 3.5–5.1)
Sodium: 138 mmol/L (ref 135–145)
Total Bilirubin: 0.6 mg/dL (ref 0.3–1.2)
Total Protein: 7.6 g/dL (ref 6.5–8.1)

## 2015-10-12 LAB — CBC
HEMATOCRIT: 40.2 % (ref 35.0–47.0)
Hemoglobin: 13.6 g/dL (ref 12.0–16.0)
MCH: 31.1 pg (ref 26.0–34.0)
MCHC: 33.9 g/dL (ref 32.0–36.0)
MCV: 91.6 fL (ref 80.0–100.0)
Platelets: 303 10*3/uL (ref 150–440)
RBC: 4.39 MIL/uL (ref 3.80–5.20)
RDW: 13.6 % (ref 11.5–14.5)
WBC: 10.1 10*3/uL (ref 3.6–11.0)

## 2015-10-12 LAB — C DIFFICILE QUICK SCREEN W PCR REFLEX
C DIFFICILE (CDIFF) INTERP: NEGATIVE
C Diff antigen: NEGATIVE
C Diff toxin: NEGATIVE

## 2015-10-12 LAB — TROPONIN I

## 2015-10-12 LAB — PREGNANCY, URINE: Preg Test, Ur: NEGATIVE

## 2015-10-12 LAB — LIPASE, BLOOD: Lipase: 33 U/L (ref 11–51)

## 2015-10-12 LAB — POCT PREGNANCY, URINE: PREG TEST UR: NEGATIVE

## 2015-10-12 MED ORDER — ONDANSETRON HCL 4 MG/2ML IJ SOLN
4.0000 mg | Freq: Once | INTRAMUSCULAR | Status: AC
  Administered 2015-10-12: 4 mg via INTRAVENOUS
  Filled 2015-10-12: qty 2

## 2015-10-12 MED ORDER — MORPHINE SULFATE (PF) 4 MG/ML IV SOLN
4.0000 mg | Freq: Once | INTRAVENOUS | Status: AC
  Administered 2015-10-12: 4 mg via INTRAVENOUS
  Filled 2015-10-12: qty 1

## 2015-10-12 MED ORDER — LOPERAMIDE HCL 2 MG PO CAPS
2.0000 mg | ORAL_CAPSULE | Freq: Once | ORAL | Status: AC
  Administered 2015-10-12: 2 mg via ORAL
  Filled 2015-10-12: qty 1

## 2015-10-12 MED ORDER — ONDANSETRON 4 MG PO TBDP: 4.0000 mg | ORAL_TABLET | Freq: Four times a day (QID) | ORAL | Status: DC | PRN

## 2015-10-12 MED ORDER — DIATRIZOATE MEGLUMINE & SODIUM 66-10 % PO SOLN
15.0000 mL | Freq: Once | ORAL | Status: AC
  Administered 2015-10-12: 15 mL via ORAL

## 2015-10-12 MED ORDER — IOPAMIDOL (ISOVUE-300) INJECTION 61%
100.0000 mL | Freq: Once | INTRAVENOUS | Status: AC | PRN
  Administered 2015-10-12: 100 mL via INTRAVENOUS

## 2015-10-12 MED ORDER — SODIUM CHLORIDE 0.9 % IV BOLUS (SEPSIS)
1000.0000 mL | Freq: Once | INTRAVENOUS | Status: AC
  Administered 2015-10-12: 1000 mL via INTRAVENOUS

## 2015-10-12 NOTE — ED Notes (Signed)
Patient returned from radiology

## 2015-10-12 NOTE — ED Notes (Signed)
MD at bedside. 

## 2015-10-12 NOTE — ED Provider Notes (Addendum)
[facility] Emergency Department Provider Note  ____________________________________________  Time seen: Approximately 10:06 PM  I have reviewed the triage vital signs and the nursing notes.   HISTORY  Chief Complaint Abdominal Pain    HPI Sandra Cantrell is a 50 y.o. female part she's been having abdominal pain nausea and "diarrhea" since Thursday. She reports she ate a sandwich that began having nausea, vomited a few times but has had multiple episodes of loose watery stool. No black or bloody stool. She is having intermittent severe crampy abdominal pain. She reports somewhat similar when she was diagnosed with an ulcer, though she is continued on her acid medicine. She has not had a fever, chills, travel, bloody diarrhea. She has not tried anything at home to relieve her symptoms.  No chest pain, shortness of breath, numbness tingling or weakness. She has her felt lightheaded with standing because she feels dehydrated.  Past Medical History  Diagnosis Date  . Post traumatic stress disorder (PTSD)   . Anxiety   . Depression   . Stress headaches   . Multiple allergies     Patient Active Problem List   Diagnosis Date Noted  . Post traumatic stress disorder (PTSD) 09/05/2015  . PTSD (post-traumatic stress disorder)   . Abdominal pain 09/04/2015    Past Surgical History  Procedure Laterality Date  . Tonsillectomy    . Dilation and curettage of uterus    . Tubal ligation    . Wrist arthroscopy Left 02/13/2015    Procedure: LEFT ARTHROSCOPY WRIST VOLAR GANGLION EXCISION;  Surgeon: Betha Loa, MD;  Location: [facility];  Service: Orthopedics;  Laterality: Left;  . Ganglion cyst excision Left 02/13/2015    Procedure: REMOVAL GANGLION OF WRIST;  Surgeon: Betha Loa, MD;  Location: [facility];  Service: Orthopedics;  Laterality: Left;  . Esophagogastroduodenoscopy N/A 09/05/2015    Procedure: ESOPHAGOGASTRODUODENOSCOPY  (EGD);  Surgeon: Scot Jun, MD;  Location: [facility];  Service: Endoscopy;  Laterality: N/A;    Current Outpatient Rx  Name  Route  Sig  Dispense  Refill  . aspirin-acetaminophen-caffeine (EXCEDRIN MIGRAINE) 250-250-65 MG tablet   Oral   Take 1 tablet by mouth every 6 (six) hours as needed for headache.         . Brexpiprazole 1 MG TABS   Oral   Take 2 tablets by mouth daily.         . calcium carbonate (TUMS - DOSED IN MG ELEMENTAL CALCIUM) 500 MG chewable tablet   Oral   Chew 1 tablet by mouth as needed for indigestion or heartburn.         Marland Kitchen FLUoxetine (PROZAC) 20 MG capsule   Oral   Take 1 capsule (20 mg total) by mouth daily.   30 capsule   1   . ondansetron (ZOFRAN ODT) 4 MG disintegrating tablet   Oral   Take 1 tablet (4 mg total) by mouth every 6 (six) hours as needed for nausea or vomiting.   20 tablet   0   . pantoprazole (PROTONIX) 40 MG tablet   Oral   Take 1 tablet (40 mg total) by mouth 2 (two) times daily.   60 tablet   1   . sucralfate (CARAFATE) 1 GM/10ML suspension   Oral   Take 10 mLs (1 g total) by mouth 4 (four) times daily -  with meals and at bedtime.   420 mL   0     Allergies  Penicillins  Family History  Problem Relation Age of Onset  . Diabetes Mellitus II Father   . Diabetes Mellitus II Mother   . Breast cancer Maternal Aunt   . CAD Maternal Grandfather     Social History Social History  Substance Use Topics  . Smoking status: Current Every Day Smoker -- 0.75 packs/day    Types: Cigarettes  . Smokeless tobacco: Never Used  . Alcohol Use: No    Review of Systems Constitutional: No fever/chills Eyes: No visual changes. ENT: No sore throat. Cardiovascular: Denies chest pain. Respiratory: Denies shortness of breath. Gastrointestinal:   No constipation. Genitourinary: Negative for dysuria. Musculoskeletal: Negative for back pain. Skin: Negative for rash. Neurological: Negative for headaches, focal  weakness or numbness.  10-point ROS otherwise negative.  ____________________________________________   PHYSICAL EXAM:  VITAL SIGNS: ED Triage Vitals  Enc Vitals Group     BP 10/12/15 1708 108/57 mmHg     Pulse Rate 10/12/15 1708 92     Resp 10/12/15 1708 18     Temp 10/12/15 1708 98.6 F (37 C)     Temp Source 10/12/15 1708 Oral     SpO2 10/12/15 1708 100 %     Weight 10/12/15 1708 205 lb (92.987 kg)     Height 10/12/15 1708 5\' 5"  (1.651 m)     Head Cir --      Peak Flow --      Pain Score 10/12/15 1708 8     Pain Loc --      Pain Edu? --      Excl. in GC? --    Constitutional: Alert and oriented. Well appearing and in no acute distress. Eyes: Conjunctivae are normal. PERRL. EOMI. Head: Atraumatic. Nose: No congestion/rhinnorhea. Mouth/Throat: Mucous membranes are moist.  Oropharynx non-erythematous. Neck: No stridor.   Cardiovascular: Normal rate, regular rhythm. Grossly normal heart sounds.  Good peripheral circulation. Respiratory: Normal respiratory effort.  No retractions. Lungs CTAB. Gastrointestinal: Soft With mild tenderness diffusely, no peritonitis, guarding, or focal tenderness noted. No distention. No CVA tenderness. Musculoskeletal: No lower extremity tenderness nor edema.  No joint effusions. Neurologic:  Normal speech and language. No gross focal neurologic deficits are appreciated. No gait instability, patient is able to stand and ambulate well in the room. Skin:  Skin is warm, dry and intact. No rash noted. Psychiatric: Mood and affect are normal. Speech and behavior are normal.  ____________________________________________   LABS (all labs ordered are listed, but only abnormal results are displayed)  Labs Reviewed  COMPREHENSIVE METABOLIC PANEL - Abnormal; Notable for the following:    Glucose, Bld 121 (*)    Creatinine, Ser 1.16 (*)    GFR calc non Af Amer 55 (*)    All other components within normal limits  URINALYSIS COMPLETEWITH MICROSCOPIC  ([facility] ONLY) - Abnormal; Notable for the following:    Color, Urine YELLOW (*)    APPearance HAZY (*)    Hgb urine dipstick 3+ (*)    Protein, ur 30 (*)    Bacteria, UA RARE (*)    Squamous Epithelial / LPF 6-30 (*)    All other components within normal limits  GASTROINTESTINAL PANEL BY PCR, STOOL (REPLACES STOOL CULTURE)  C DIFFICILE QUICK SCREEN W PCR REFLEX  LIPASE, BLOOD  CBC  TROPONIN I  PREGNANCY, URINE  POC URINE PREG, ED  POCT PREGNANCY, URINE   ____________________________________________  EKG  ED ECG REPORT I, Latarshia Jersey, the attending physician, personally viewed and interpreted this ECG.  Date: 10/12/2015 EKG Time: 1715 Rate: 90 Rhythm: normal sinus rhythm QRS Axis: normal Intervals: normal ST/T Wave abnormalities: normal Conduction Disturbances: none Narrative Interpretation: unremarkable  ____________________________________________  RADIOLOGY   CT Abdomen Pelvis W Contrast (Final result) Result time: 10/12/15 21:13:37   Final result by Rad Results In Interface (10/12/15 21:13:37)   Narrative:   CLINICAL DATA: Pt c/o abdominal pain that consists all over her stomach with N/V/D. Patient states she has vomited 2x in the past 24 hours and constant diarrhea.  EXAM: CT ABDOMEN AND PELVIS WITH CONTRAST  TECHNIQUE: Multidetector CT imaging of the abdomen and pelvis was performed using the standard protocol following bolus administration of intravenous contrast.  CONTRAST: [id number]  COMPARISON: 09/04/15  FINDINGS: Lower chest: negative  Hepatobiliary: Moderate steatosis. Otherwise negative.  Pancreas: negative  Spleen: negative  Adrenals/Urinary Tract: negative  Stomach/Bowel: negative including appendix  Vascular/Lymphatic: No vascular abnormalities. Numerous periaortic retroperitoneal lymph nodes, not pathologically enlarged.  Reproductive: negative  Other: No ascites  Musculoskeletal: No acute  findings.  IMPRESSION: No acute findings to account for the patient's symptoms   Electronically Signed By: Esperanza Heir M.D. On: 10/12/2015 21:13       ____________________________________________   PROCEDURES  Procedure(s) performed: None  Critical Care performed: No  ____________________________________________   INITIAL IMPRESSION / ASSESSMENT AND PLAN / ED COURSE  Pertinent labs & imaging results that were available during my care of the patient were reviewed by me and considered in my medical decision making (see chart for details).  Differential diagnosis includes but is not limited to, abdominal perforation, aortic dissection, cholecystitis, appendicitis, diverticulitis, colitis, esophagitis/gastritis, kidney stone, pyelonephritis, urinary tract infection, aortic aneurysm. All are considered in decision and treatment plan. Based upon the patient's presentation and risk factors, we'll proceed with CT to evaluate for potential etiologies including colitis, diverticulitis, bowel obstruction, etc.  ----------------------------------------- 10:10 PM on 10/12/2015 -----------------------------------------  Patient reports symptoms improved. She is resting comfortably, hemodynamically stable, ambulatory and in no distress. She is taking by mouth well. No indication of acute process by labs or CT. I will culture her urine, though it appears likely slightly contaminated, and she denies any urinary symptoms. Traditional abdominal pain return precautions given.  Return precautions and treatment recommendations and follow-up discussed with the patient who is agreeable with the plan.   ____________________________________________   FINAL CLINICAL IMPRESSION(S) / ED DIAGNOSES  Final diagnoses:  Non-intractable vomiting with nausea, vomiting of unspecified type      Sharyn Creamer, MD 10/12/15 2212   Patient's family arrives, states she looks much better. They do  however tell me that she was so dehydrated that she almost passed out yesterday, and fell forward hitting his face. The patient states that she did not wish to tell me this earlier, but at the present time she feels much better. She is awake alert ambulatory and in no distress. Denies any headache without any evidence of facial trauma except a small contusion is noted over the bridge of the nose. No indication for CT imaging at this time.  Sharyn Creamer, MD 10/12/15 2229

## 2015-10-12 NOTE — ED Notes (Signed)
Pt comes into the ED via POV c/o abdominal pain that consists all over her stomach with N/V/D.  Patient states she has vomited 2x in the past 24 hours and constant diarrhea.  Patient states "I am feeling weak to the point where I am lightheaded".  Patient in NAD at this time with even and unlabored respirations.  Patient recently in the hospital with the diagnosis of an ulcer.

## 2015-10-12 NOTE — Discharge Instructions (Signed)
Please continue your current medications including your acid reducing medication.  You were seen in the emergency room for abdominal pain. It is important that you follow up closely with your primary care doctor in the next couple of days.  If you're unable to see her primary care doctor you may return to the emergency room or go to the [facility] walk-in clinic in 1 or 2 days for reexam.  Please return to the emergency room right away if you are to develop a fever, severe nausea, your pain becomes severe or worsens, you are unable to keep food down, begin vomiting any dark or bloody fluid, you develop any dark or bloody stools, feel dehydrated, or other new concerns or symptoms arise.   Nausea and Vomiting Nausea is a sick feeling that often comes before throwing up (vomiting). Vomiting is a reflex where stomach contents come out of your mouth. Vomiting can cause severe loss of body fluids (dehydration). Children and elderly adults can become dehydrated quickly, especially if they also have diarrhea. Nausea and vomiting are symptoms of a condition or disease. It is important to find the cause of your symptoms. CAUSES   Direct irritation of the stomach lining. This irritation can result from increased acid production (gastroesophageal reflux disease), infection, food poisoning, taking certain medicines (such as nonsteroidal anti-inflammatory drugs), alcohol use, or tobacco use.  Signals from the brain.These signals could be caused by a headache, heat exposure, an inner ear disturbance, increased pressure in the brain from injury, infection, a tumor, or a concussion, pain, emotional stimulus, or metabolic problems.  An obstruction in the gastrointestinal tract (bowel obstruction).  Illnesses such as diabetes, hepatitis, gallbladder problems, appendicitis, kidney problems, cancer, sepsis, atypical symptoms of a heart attack, or eating disorders.  Medical treatments such as chemotherapy and  radiation.  Receiving medicine that makes you sleep (general anesthetic) during surgery. DIAGNOSIS Your caregiver may ask for tests to be done if the problems do not improve after a few days. Tests may also be done if symptoms are severe or if the reason for the nausea and vomiting is not clear. Tests may include:  Urine tests.  Blood tests.  Stool tests.  Cultures (to look for evidence of infection).  X-rays or other imaging studies. Test results can help your caregiver make decisions about treatment or the need for additional tests. TREATMENT You need to stay well hydrated. Drink frequently but in small amounts.You may wish to drink water, sports drinks, clear broth, or eat frozen ice pops or gelatin dessert to help stay hydrated.When you eat, eating slowly may help prevent nausea.There are also some antinausea medicines that may help prevent nausea. HOME CARE INSTRUCTIONS   Take all medicine as directed by your caregiver.  If you do not have an appetite, do not force yourself to eat. However, you must continue to drink fluids.  If you have an appetite, eat a normal diet unless your caregiver tells you differently.  Eat a variety of complex carbohydrates (rice, wheat, potatoes, bread), lean meats, yogurt, fruits, and vegetables.  Avoid high-fat foods because they are more difficult to digest.  Drink enough water and fluids to keep your urine clear or pale yellow.  If you are dehydrated, ask your caregiver for specific rehydration instructions. Signs of dehydration may include:  Severe thirst.  Dry lips and mouth.  Dizziness.  Dark urine.  Decreasing urine frequency and amount.  Confusion.  Rapid breathing or pulse. SEEK IMMEDIATE MEDICAL CARE IF:   You have  blood or brown flecks (like coffee grounds) in your vomit.  You have black or bloody stools.  You have a severe headache or stiff neck.  You are confused.  You have severe abdominal pain.  You have  chest pain or trouble breathing.  You do not urinate at least once every 8 hours.  You develop cold or clammy skin.  You continue to vomit for longer than 24 to 48 hours.  You have a fever. MAKE SURE YOU:   Understand these instructions.  Will watch your condition.  Will get help right away if you are not doing well or get worse.   This information is not intended to replace advice given to you by your health care provider. Make sure you discuss any questions you have with your health care provider.   Document Released: 04/19/2005 Document Revised: 07/12/2011 Document Reviewed: 09/16/2010 Elsevier Interactive Patient Education [employer]

## 2015-10-14 ENCOUNTER — Telehealth: Payer: Self-pay | Admitting: Emergency Medicine

## 2015-10-14 LAB — URINE CULTURE: Special Requests: NORMAL

## 2015-10-14 NOTE — ED Notes (Signed)
Called pt to inform of results of urine and stool tests.  She is still feeling bad.  i advised she needs to follow up--she does not have pcp, but has been referred.

## 2015-11-17 ENCOUNTER — Ambulatory Visit: Payer: Worker's Compensation | Admitting: Occupational Therapy

## 2015-11-27 ENCOUNTER — Ambulatory Visit: Payer: Worker's Compensation | Attending: Orthopedic Surgery | Admitting: Occupational Therapy

## 2015-11-27 DIAGNOSIS — M25632 Stiffness of left wrist, not elsewhere classified: Secondary | ICD-10-CM

## 2015-11-27 DIAGNOSIS — M79642 Pain in left hand: Secondary | ICD-10-CM | POA: Diagnosis present

## 2015-11-27 DIAGNOSIS — M6281 Muscle weakness (generalized): Secondary | ICD-10-CM

## 2015-11-27 DIAGNOSIS — M79632 Pain in left forearm: Secondary | ICD-10-CM | POA: Diagnosis present

## 2015-11-27 NOTE — Therapy (Signed)
[place] [facility] [address], [place], [address] Phone: [phone]   Fax:  [phone]  Occupational Therapy Treatment  Patient Details  Name: Sandra Cantrell MRN: [id number] Date of Birth: 1965/12/10 Referring Provider: Merlyn Lot  Encounter Date: 11/27/2015      OT End of Session - 11/27/15 1423    Visit Number 1   Number of Visits 6   Date for OT Re-Evaluation 01/01/16   OT Start Time 1345   OT Stop Time 1444   OT Time Calculation (min) 59 min   Activity Tolerance Patient tolerated treatment well   Behavior During Therapy [facility] for tasks assessed/performed      Past Medical History:  Diagnosis Date  . Anxiety   . Depression   . Multiple allergies   . Post traumatic stress disorder (PTSD)   . Stress headaches     Past Surgical History:  Procedure Laterality Date  . DILATION AND CURETTAGE OF UTERUS    . ESOPHAGOGASTRODUODENOSCOPY N/A 09/05/2015   Procedure: ESOPHAGOGASTRODUODENOSCOPY (EGD);  Surgeon: Scot Jun, MD;  Location: [facility];  Service: Endoscopy;  Laterality: N/A;  . GANGLION CYST EXCISION Left 02/13/2015   Procedure: REMOVAL GANGLION OF WRIST;  Surgeon: Betha Loa, MD;  Location: [facility];  Service: Orthopedics;  Laterality: Left;  . TONSILLECTOMY    . TUBAL LIGATION    . WRIST ARTHROSCOPY Left 02/13/2015   Procedure: LEFT ARTHROSCOPY WRIST VOLAR GANGLION EXCISION;  Surgeon: Betha Loa, MD;  Location: [facility];  Service: Orthopedics;  Laterality: Left;    There were no vitals filed for this visit.      Subjective Assessment - 11/27/15 1352    Subjective  I went to see MD and he did shot at wrist that helped for pain - and then  got a ointment - that I am  putting on 2 -3 x day - pain was good after shot but returning now again - and they had trouble schduling me with you    Patient Stated Goals Want to ride my motorcycle again , I love to fix  things but without pain or difficulty, use my hand lke before    Currently in Pain? Yes   Pain Score 4    Pain Location Wrist   Pain Orientation Left   Pain Descriptors / Indicators Throbbing;Aching   Pain Type Chronic pain            [facility] OT Assessment - 11/27/15 0001      Assessment   Diagnosis Scapholunate lig injry , CTS, irritaion ulnar N    Referring Provider Kuzma   Onset Date 02/13/15   Prior Therapy April last seen      Precautions   Precaution Comments 5 lbs limit     Home  Environment   Lives With Alone     Prior Function   Level of Independence Independent   Vocation Workers comp   Leisure R hand dominant , likes to fix things around the house , ride motorcycle,      AROM   Right Wrist Extension 70 Degrees   Right Wrist Flexion 80 Degrees   Right Wrist Radial Deviation 22 Degrees   Right Wrist Ulnar Deviation 42 Degrees   Left Wrist Extension 48 Degrees   Left Wrist Flexion 73 Degrees   Left Wrist Radial Deviation 20 Degrees   Left Wrist Ulnar Deviation 22 Degrees     Strength  Right Hand Grip (lbs) 70   Right Hand Lateral Pinch 21 lbs   Right Hand 3 Point Pinch 18 lbs   Left Hand Grip (lbs) 55   Left Hand Lateral Pinch 15 lbs   Left Hand 3 Point Pinch 15 lbs      Fluidotherapy done with AROM to wrist in all planes prior to review of HEP for ROM  Pt to cont with putty HEP and wrist strengthening  1 lbs                     OT Education - 11/27/15 1423    Education provided Yes   Education Details HEP   Person(s) Educated Patient   Methods Explanation;Demonstration;Tactile cues;Verbal cues   Comprehension Verbal cues required;Returned demonstration;Verbalized understanding          OT Short Term Goals - 11/27/15 2153      OT SHORT TERM GOAL #1   Title AROM of wrist improve to [facility] to return using hand fully in selfcare without increase symptoms    Baseline increase AROM and use - but pain still increase with donning bra,  buttons , fix hair, pull up pants , squeeze toothpast - pushing out of tub    Time 3   Period Weeks   Status New     OT SHORT TERM GOAL #2   Title Pain on PRHWE improve by at least 15 points    Baseline Pain on PRWHE at eval 42/50   Time 3   Period Weeks   Status New     OT SHORT TERM GOAL #3   Title Pt to be ind in HEP to increase ROM and decrease pain   Baseline pain still increase  with use and ROM    Time 3   Period Weeks   Status New           OT Long Term Goals - 11/27/15 2154      OT LONG TERM GOAL #1   Title Grip strength in L hand  increase by at least more than 50% compare to the R  to cut food and carry at least 8 lbs    Baseline Grip 55 on L , R 70 lbs  - still trouble carrying ojbects   Time 3   Period Weeks   Status New     OT LONG TERM GOAL #2   Title Function on PRWHE improve with 20 points    Baseline At eval for function PRWHE 38.5/50   Time 4   Period Weeks   Status New               Plan - 11/27/15 2142    Clinical Impression Statement Pt refer to OT after receiving shot for wrist pain in May  - pt report it helped a little in the beginning but pain returning again - pt present with AROM about the same than 3 months ago - increase pain with  extention  and flexion - stiffness in flexion /extention ,  decrease strength - fatigue - pt comt to report nerve pain over dorsal hand  on ulnar side of hand/wrist - pt did had nerve conduction test earlier this week - do not know results yet    Rehab Potential Fair   Clinical Impairments Affecting Rehab Potential Been about year ago injury and out of work    OT Frequency 2x / week   OT Duration 4 weeks   OT Treatment/Interventions Self-care/ADL  training;Fluidtherapy;Splinting;Patient/family education;Therapeutic exercises;Therapeutic exercise;Scar mobilization;Manual Therapy;Parrafin;Passive range of motion   Plan assess progress and upgrade HEP as needed    OT Home Exercise Plan see pt instruction       Patient will benefit from skilled therapeutic intervention in order to improve the following deficits and impairments:  Decreased range of motion, Impaired flexibility, Decreased scar mobility, Impaired UE functional use, Pain, Decreased strength, Increased edema  Visit Diagnosis: Stiffness of left wrist joint - Plan: Ot plan of care cert/re-cert  Pain of left forearm - Plan: Ot plan of care cert/re-cert  Muscle weakness - Plan: Ot plan of care cert/re-cert  Pain in left hand - Plan: Ot plan of care cert/re-cert    Problem List Patient Active Problem List   Diagnosis Date Noted  . Post traumatic stress disorder (PTSD) 09/05/2015  . PTSD (post-traumatic stress disorder)   . Abdominal pain 09/04/2015    Oletta Cohn OTR/L,CLT  11/27/2015, 9:59 PM  [facility] PH[facility]ICAL [facility] SPORTS MEDICINE [address], [place], [address] Phone: [phone]   Fax:  [phone]  Name: Sandra Cantrell MRN: [id number] Date of Birth: [birth year]

## 2015-11-27 NOTE — Patient Instructions (Signed)
Putty - green for gripping  lat and 3 point grip  12 -15 reps   2 sets   Pain under 3/10   1 lbs weight for wrist in all planes - 12 reps   pain under 3/10

## 2015-12-03 ENCOUNTER — Ambulatory Visit: Payer: Worker's Compensation | Attending: Orthopedic Surgery | Admitting: Occupational Therapy

## 2015-12-03 DIAGNOSIS — M79632 Pain in left forearm: Secondary | ICD-10-CM | POA: Diagnosis present

## 2015-12-03 DIAGNOSIS — M79642 Pain in left hand: Secondary | ICD-10-CM | POA: Insufficient documentation

## 2015-12-03 DIAGNOSIS — M6281 Muscle weakness (generalized): Secondary | ICD-10-CM | POA: Diagnosis present

## 2015-12-03 DIAGNOSIS — M25632 Stiffness of left wrist, not elsewhere classified: Secondary | ICD-10-CM | POA: Diagnosis present

## 2015-12-03 NOTE — Patient Instructions (Addendum)
AROM and PROM to wrist to tolerance  Putty to tolerance and pain allowing

## 2015-12-03 NOTE — Therapy (Signed)
[facility] [address] [place], [address] Phone: [phone]   Fax:  [phone]  Occupational Therapy Treatment  Patient Details  Name: Sandra Cantrell MRN: [id number] Date of Birth: 09/17/1965 Referring Provider: Merlyn Lot  Encounter Date: 12/03/2015      OT End of Session - 12/03/15 1450    Visit Number 2   Number of Visits 6   Date for OT Re-Evaluation 01/01/16   OT Start Time 1440   OT Stop Time 1525   OT Time Calculation (min) 45 min   Activity Tolerance Patient tolerated treatment well   Behavior During Therapy [facility] for tasks assessed/performed      Past Medical History:  Diagnosis Date  . Anxiety   . Depression   . Multiple allergies   . Post traumatic stress disorder (PTSD)   . Stress headaches     Past Surgical History:  Procedure Laterality Date  . DILATION AND CURETTAGE OF UTERUS    . ESOPHAGOGASTRODUODENOSCOPY N/A 09/05/2015   Procedure: ESOPHAGOGASTRODUODENOSCOPY (EGD);  Surgeon: Scot Jun, MD;  Location: [facility];  Service: Endoscopy;  Laterality: N/A;  . GANGLION CYST EXCISION Left 02/13/2015   Procedure: REMOVAL GANGLION OF WRIST;  Surgeon: Betha Loa, MD;  Location: [facility];  Service: Orthopedics;  Laterality: Left;  . TONSILLECTOMY    . TUBAL LIGATION    . WRIST ARTHROSCOPY Left 02/13/2015   Procedure: LEFT ARTHROSCOPY WRIST VOLAR GANGLION EXCISION;  Surgeon: Betha Loa, MD;  Location: [facility];  Service: Orthopedics;  Laterality: Left;    There were no vitals filed for this visit.      Subjective Assessment - 12/03/15 1441    Subjective  Pain is back to where it was before shot - appt with MD next week about results of nerve conduction test - pain is like constrant ache and shooting pain at base of thumb    Patient Stated Goals Want to ride my motorcycle again , I love to fix things but without pain or difficulty, use my hand  lke before    Currently in Pain? Yes   Pain Score 5    Pain Location Wrist   Pain Orientation Left   Pain Descriptors / Indicators Aching                      OT Treatments/Exercises (OP) - 12/03/15 0001      Electrical Stimulation   Electrical Stimulation Location 10   Electrical Stimulation Action Radial side of wrist and dorsal wrist  and mid forarm - protocol for hand and wirst pain - current 6.5 - pain control       LUE Fluidotherapy   Number Minutes Fluidotherapy 10 Minutes   LUE Fluidotherapy Location Hand;Wrist      SOft tissue mobs with Graston tools 2 and 4 - sweeping , scooping an brushing over radial side of forearm , volar wrist and forearm , dorsal forearm prior to ROM - trigger points over radial side - but decrease to end and then proximal forearm at elbow on dorsal and radially mostly    Gentle PROM for wrist flexion , extention ,RD and UD - pain increase with all   pain decrease to 3/10 after paraffin and then increase again with AROM              OT Education - 12/03/15 1450    Education provided Yes   Education Details  HEP   Person(s) Educated Patient   Methods Explanation;Tactile cues;Verbal cues;Demonstration   Comprehension Verbalized understanding;Returned demonstration;Verbal cues required          OT Short Term Goals - 11/27/15 2153      OT SHORT TERM GOAL #1   Title AROM of wrist improve to [facility] to return using hand fully in selfcare without increase symptoms    Baseline increase AROM and use - but pain still increase with donning bra, buttons , fix hair, pull up pants , squeeze toothpast - pushing out of tub    Time 3   Period Weeks   Status New     OT SHORT TERM GOAL #2   Title Pain on PRHWE improve by at least 15 points    Baseline Pain on PRWHE at eval 42/50   Time 3   Period Weeks   Status New     OT SHORT TERM GOAL #3   Title Pt to be ind in HEP to increase ROM and decrease pain   Baseline pain still  increase  with use and ROM    Time 3   Period Weeks   Status New           OT Long Term Goals - 11/27/15 2154      OT LONG TERM GOAL #1   Title Grip strength in L hand  increase by at least more than 50% compare to the R  to cut food and carry at least 8 lbs    Baseline Grip 55 on L , R 70 lbs  - still trouble carrying ojbects   Time 3   Period Weeks   Status New     OT LONG TERM GOAL #2   Title Function on PRWHE improve with 20 points    Baseline At eval for function PRWHE 38.5/50   Time 4   Period Weeks   Status New               Plan - 12/03/15 1612    Clinical Impression Statement Pt pain increase since last time - per pt feels like before the shot constand ache at wrist , radial side and ulnar n pain on ulnar side of hand - did soft tissue mobs to wrist and forearm after parafin to decrease pain and increase ROM - pt to see MD on Tuesday    Rehab Potential Fair   Clinical Impairments Affecting Rehab Potential Been about year ago injury and out of work    OT Frequency 2x / week   OT Duration 4 weeks   OT Treatment/Interventions Self-care/ADL training;Fluidtherapy;Splinting;Patient/family education;Therapeutic exercises;Therapeutic exercise;Scar mobilization;Manual Therapy;Parrafin;Passive range of motion   Plan appt with MD on 8th - to contact if planning change in treatment   OT Home Exercise Plan see pt instruction   Consulted and Agree with Plan of Care Patient      Patient will benefit from skilled therapeutic intervention in order to improve the following deficits and impairments:  Decreased range of motion, Impaired flexibility, Decreased scar mobility, Impaired UE functional use, Pain, Decreased strength, Increased edema  Visit Diagnosis: Stiffness of left wrist joint  Pain of left forearm  Muscle weakness  Pain in left hand    Problem List Patient Active Problem List   Diagnosis Date Noted  . Post traumatic stress disorder (PTSD) 09/05/2015   . PTSD (post-traumatic stress disorder)   . Abdominal pain 09/04/2015    Oletta Cohn OTR/L,CLT  12/03/2015, 4:15 PM  [facility] [address], [place], [address] Phone: [phone]   Fax:  [phone]  Name: Sandra Cantrell MRN: [id number] Date of Birth: 07-27-1965

## 2015-12-08 ENCOUNTER — Ambulatory Visit: Payer: Worker's Compensation | Admitting: Occupational Therapy

## 2015-12-11 ENCOUNTER — Ambulatory Visit: Payer: Worker's Compensation | Admitting: Occupational Therapy

## 2016-09-21 ENCOUNTER — Emergency Department
Admission: EM | Admit: 2016-09-21 | Discharge: 2016-09-21 | Disposition: A | Discharge status: Home / Self Care | Payer: Self-pay | Attending: Student in an Organized Health Care Education/Training Program | Admitting: Student in an Organized Health Care Education/Training Program

## 2016-09-21 ENCOUNTER — Encounter: Payer: Self-pay | Admitting: Emergency Medicine

## 2016-09-21 DIAGNOSIS — Z7982 Long term (current) use of aspirin: Secondary | ICD-10-CM | POA: Insufficient documentation

## 2016-09-21 DIAGNOSIS — H00012 Hordeolum externum right lower eyelid: Secondary | ICD-10-CM | POA: Insufficient documentation

## 2016-09-21 DIAGNOSIS — F17210 Nicotine dependence, cigarettes, uncomplicated: Secondary | ICD-10-CM | POA: Insufficient documentation

## 2016-09-21 DIAGNOSIS — J301 Allergic rhinitis due to pollen: Secondary | ICD-10-CM | POA: Insufficient documentation

## 2016-09-21 DIAGNOSIS — J40 Bronchitis, not specified as acute or chronic: Secondary | ICD-10-CM | POA: Insufficient documentation

## 2016-09-21 MED ORDER — ERYTHROMYCIN 5 MG/GM OP OINT: 1.0000 "application " | TOPICAL_OINTMENT | Freq: Four times a day (QID) | OPHTHALMIC | 0 refills | Status: DC

## 2016-09-21 MED ORDER — DEXAMETHASONE SODIUM PHOSPHATE 10 MG/ML IJ SOLN
10.0000 mg | Freq: Once | INTRAMUSCULAR | Status: AC
  Administered 2016-09-21: 10 mg via INTRAMUSCULAR
  Filled 2016-09-21: qty 1

## 2016-09-21 MED ORDER — PREDNISONE 10 MG PO TABS: 10.0000 mg | ORAL_TABLET | Freq: Two times a day (BID) | ORAL | 0 refills | Status: DC

## 2016-09-21 MED ORDER — CLINDAMYCIN HCL 150 MG PO CAPS: 300.0000 mg | ORAL_CAPSULE | Freq: Four times a day (QID) | ORAL | 0 refills | Status: AC

## 2016-09-21 MED ORDER — ACETAMINOPHEN-CODEINE #3 300-30 MG PO TABS
1.0000 | ORAL_TABLET | Freq: Once | ORAL | Status: AC
  Administered 2016-09-21: 1 via ORAL

## 2016-09-21 MED ORDER — BENZONATATE 100 MG PO CAPS: 100.0000 mg | ORAL_CAPSULE | Freq: Three times a day (TID) | ORAL | 0 refills | Status: DC | PRN

## 2016-09-21 MED ORDER — CLINDAMYCIN HCL 150 MG PO CAPS
300.0000 mg | ORAL_CAPSULE | Freq: Once | ORAL | Status: AC
  Administered 2016-09-21: 300 mg via ORAL
  Filled 2016-09-21: qty 2

## 2016-09-21 MED ORDER — ACETAMINOPHEN-CODEINE #3 300-30 MG PO TABS: 1.0000 | ORAL_TABLET | Freq: Three times a day (TID) | ORAL | 0 refills | Status: DC | PRN

## 2016-09-21 MED ORDER — ACETAMINOPHEN-CODEINE #3 300-30 MG PO TABS
ORAL_TABLET | ORAL | Status: AC
  Filled 2016-09-21: qty 1

## 2016-09-21 NOTE — ED Triage Notes (Addendum)
Patient ambulatory to triage with steady gait, without difficulty or distress noted; pt reports recent environmental allergies, now with sinus pressure, and puffiness beneath right eye

## 2016-09-21 NOTE — ED Notes (Signed)
Swelling and redness surrounding right eye with white drainage, pt denies any other problems

## 2016-09-21 NOTE — ED Provider Notes (Signed)
[facility] Emergency Department Provider Note ____________________________________________  Time seen: 2217  I have reviewed the triage vital signs and the nursing notes.  HISTORY  Chief Complaint  Allergies and Eye Problem  HPI Sandra Cantrell is a 51 y.o. female presents to the ED for evaluation of allergic rhinitis, postnasal drainage, hoarseness, and an infected stye to the right eye. Patient describes onset of symptoms of days prior, including sinus congestion and drainage. She is also noted weakness of her voice and intermittent hoarseness. She denies any fevers, chills, sweats. She has been taking Vicksnasal inhaler for intermittent symptoms. She has been apply warm compresses to the right eye and has since noted to the lid of a large pustule to the inner aspect of the lower lid.  Past Medical History:  Diagnosis Date  . Anxiety   . Depression   . Multiple allergies   . Post traumatic stress disorder (PTSD)   . Stress headaches     Patient Active Problem List   Diagnosis Date Noted  . Post traumatic stress disorder (PTSD) 09/05/2015  . PTSD (post-traumatic stress disorder)   . Abdominal pain 09/04/2015    Past Surgical History:  Procedure Laterality Date  . DILATION AND CURETTAGE OF UTERUS    . ESOPHAGOGASTRODUODENOSCOPY N/A 09/05/2015   Procedure: ESOPHAGOGASTRODUODENOSCOPY (EGD);  Surgeon: Scot Junobert T Elliott, MD;  [facility];  Service: Endoscopy;  Laterality: N/A;  . GANGLION CYST EXCISION Left 02/13/2015   Procedure: REMOVAL GANGLION OF WRIST;  Surgeon: Betha LoaKevin Kuzma, MD;  Location: [facility];  Service: Orthopedics;  Laterality: Left;  . TONSILLECTOMY    . TUBAL LIGATION    . WRIST ARTHROSCOPY Left 02/13/2015   Procedure: LEFT ARTHROSCOPY WRIST VOLAR GANGLION EXCISION;  Surgeon: Betha LoaKevin Kuzma, MD;  Location: [facility];  Service: Orthopedics;  Laterality: Left;    Prior to Admission medications    Medication Sig Start Date End Date Taking? Authorizing Provider  acetaminophen-codeine (TYLENOL #3) 300-30 MG tablet Take 1 tablet by mouth every 8 (eight) hours as needed for moderate pain. 09/21/16   Yamilee Harmes, Charlesetta IvoryJenise V Bacon, PA-C  aspirin-acetaminophen-caffeine (EXCEDRIN MIGRAINE) [id number] MG tablet Take 1 tablet by mouth every 6 (six) hours as needed for headache.    [provider]  benzonatate (TESSALON PERLES) 100 MG capsule Take 1 capsule (100 mg total) by mouth 3 (three) times daily as needed for cough (Take 1-2 per dose). 09/21/16   Sabrin Dunlevy, Charlesetta IvoryJenise V Bacon, PA-C  Brexpiprazole 1 MG TABS Take 2 tablets by mouth daily.    [provider]  calcium carbonate (TUMS - DOSED IN MG ELEMENTAL CALCIUM) 500 MG chewable tablet Chew 1 tablet by mouth as needed for indigestion or heartburn.    [provider]  clindamycin (CLEOCIN) 150 MG capsule Take 2 capsules (300 mg total) by mouth 4 (four) times daily. 09/21/16 10/01/16  Micaylah Bertucci, Charlesetta IvoryJenise V Bacon, PA-C  erythromycin ophthalmic ointment Place 1 application into the right eye 4 (four) times daily. 09/21/16   Hasini Peachey, Charlesetta IvoryJenise V Bacon, PA-C  FLUoxetine (PROZAC) 20 MG capsule Take 1 capsule (20 mg total) by mouth daily. 09/06/15   Houston SirenSainani, Vivek J, MD  ondansetron (ZOFRAN ODT) 4 MG disintegrating tablet Take 1 tablet (4 mg total) by mouth every 6 (six) hours as needed for nausea or vomiting. 10/12/15   Sharyn CreamerQuale, Mark, MD  pantoprazole (PROTONIX) 40 MG tablet Take 1 tablet (40 mg total) by mouth 2 (two) times daily. 09/06/15   Hilda LiasSainani, Vivek  J, MD  predniSONE (DELTASONE) 10 MG tablet Take 1 tablet (10 mg total) by mouth 2 (two) times daily with a meal. 09/21/16   Londen Bok, Charlesetta Ivory, PA-C  sucralfate (CARAFATE) 1 GM/10ML suspension Take 10 mLs (1 g total) by mouth 4 (four) times daily -  with meals and at bedtime. 09/06/15   Houston Siren, MD    Allergies Penicillins  Family History  Problem Relation Age of Onset  . Diabetes Mellitus  II Father   . Diabetes Mellitus II Mother   . Breast cancer Maternal Aunt   . CAD Maternal Grandfather     Social History Social History  Substance Use Topics  . Smoking status: Current Every Day Smoker    Packs/day: 0.75    Types: Cigarettes  . Smokeless tobacco: Never Used  . Alcohol use No    Review of Systems  Constitutional: Negative for fever. Eyes: Negative for visual changes. Right eye lower lid sty ENT: Negative for sore throat. Reports hoarseness Cardiovascular: Negative for chest pain. Respiratory: Negative for shortness of breath. Reports non-productive cough Gastrointestinal: Negative for abdominal pain, vomiting and diarrhea. Neurological: Negative for headaches, focal weakness or numbness. ____________________________________________  PHYSICAL EXAM:  VITAL SIGNS: ED Triage Vitals  Enc Vitals Group     BP 09/21/16 2124 120/70     Pulse Rate 09/21/16 2124 91     Resp 09/21/16 2124 18     Temp 09/21/16 2124 98.3 F (36.8 C)     Temp Source 09/21/16 2124 Oral     SpO2 09/21/16 2124 98 %     Weight 09/21/16 2123 190 lb (86.2 kg)     Height 09/21/16 2123 5\' 5"  (1.651 m)     Head Circumference --      Peak Flow --      Pain Score 09/21/16 2124 7     Pain Loc --      Pain Edu? --      Excl. in GC? --     Constitutional: Alert and oriented. Well appearing and in no distress. Head: Normocephalic and atraumatic. Eyes: Conjunctivae are normal. PERRL. Normal extraocular movements. The lower lid on the right eye shows infraorbital erythema and the inner canthus of the lower lid shows palpable fullness and spontaneous purulent drainage. Ears: Canals clear. TMs intact bilaterally. Nose: No congestion/rhinorrhea/epistaxis. Mouth/Throat: Mucous membranes are moist. Uvula is midline and tonsils are flat. No oropharyngeal erythema or tonsillar exudates noted. Some general edema consistent with postnasal drainage is noted. Neck: Supple. No  thyromegaly. Hematological/Lymphatic/Immunological: No preauricular lymphadenopathy. Cardiovascular: Normal rate, regular rhythm. Normal distal pulses. Respiratory: Normal respiratory effort. No wheezes/rales/rhonchi. Skin:  Skin is warm, dry and intact. No rash noted. ____________________________________________  PROCEDURES  Decadron 10 mg IM Tylenol #3 1 PO ____________________________________________  INITIAL IMPRESSION / ASSESSMENT AND PLAN / ED COURSE  Patient with what appears to be an infected stye or hordeolum of the right lower lid. Seasonal rhinitis with post nasal drainage, and an acute likely viral bronchitis. She is discharged with prescriptions for clindamycin, Tessalon Perles, erythromycin ophthalmic ointment, Tylenol 3, and prednisone. She continued to apply warm compresses to the eye to promote healing. She will use antibiotic ointment to the eye for lubrication. She will to dose over-the-counter nondrowsy allergy medicine. She should follow with local community clinics or return to the ED for acutely worsening symptoms as discussed. ____________________________________________  FINAL CLINICAL IMPRESSION(S) / ED DIAGNOSES  Final diagnoses:  Hordeolum externum of right lower eyelid  Bronchitis  Seasonal allergic rhinitis due to pollen      Clearnce Leja, Charlesetta Ivory, PA-C 09/21/16 2341    Willy Eddy, MD 09/22/16 [phone]

## 2016-09-21 NOTE — Discharge Instructions (Signed)
You are being treated for an infection of the eyelid, likely a ruptured stye. Take the antibiotic as directed until all pills are gone. Apply warm compresses to promote healing. Take the other medicines as directed. Follow-up with one of the community clinics or return to the ED as needed.

## 2016-12-07 IMAGING — CT CT ABD-PELV W/ CM
1 of 3 series · 14 of 32 positions shown, 19 images · IV contrast (iopamidol)
Comparison: None.

CLINICAL DATA: Mid abdominal pain since yesterday. Nausea and
vomiting.

EXAM:
CT ABDOMEN AND PELVIS WITH CONTRAST
TECHNIQUE: Multidetector CT imaging of the abdomen and pelvis was performed
using the standard protocol following bolus administration of
intravenous contrast.
CONTRAST:  100mL 41JTOT-TKK IOPAMIDOL (41JTOT-TKK) INJECTION 61%

[Series 2: routine abd pel with · axial · 0.80mm/px · z∈[-296,+144]mm · 14 of 98 slices shown, 19 images]
[im 5/98  soft-tissue]
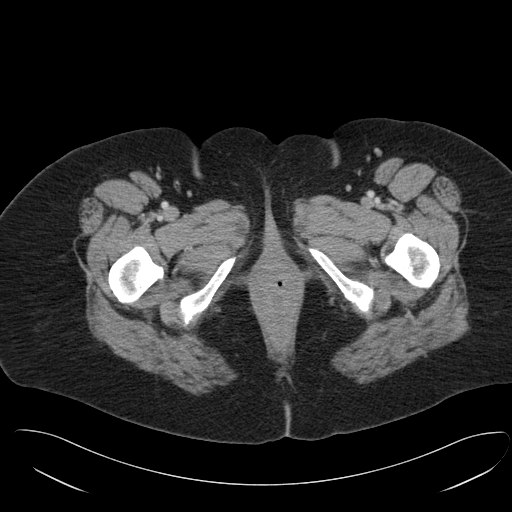
[im 5/98  bone]
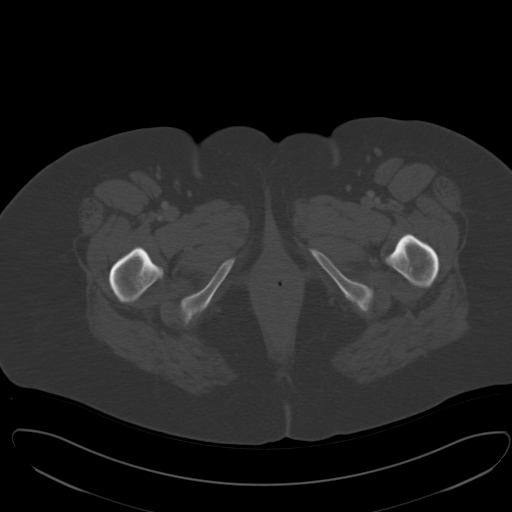
[im 15/98  soft-tissue]
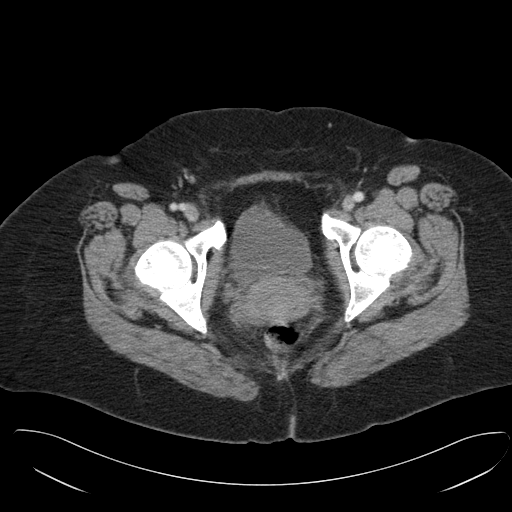
[im 20/98  soft-tissue]
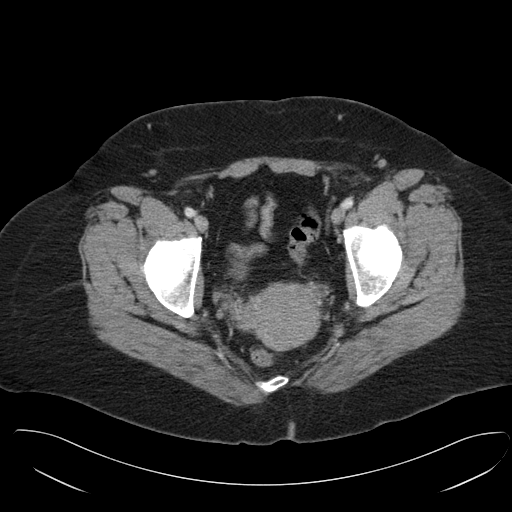
[im 30/98  soft-tissue]
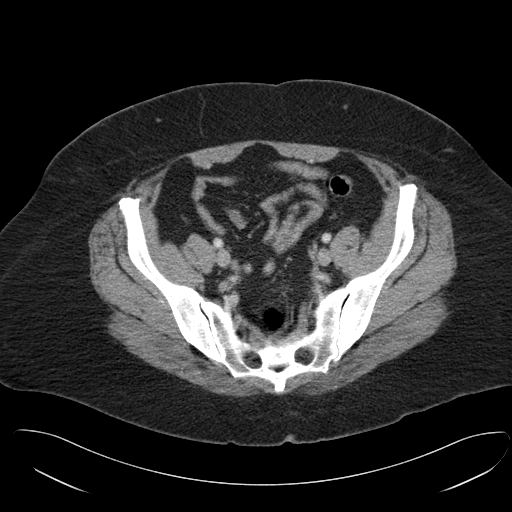
[im 34/98  soft-tissue]
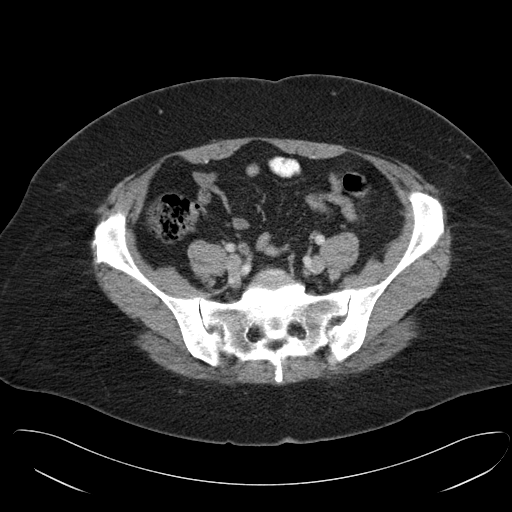
[im 44/98  soft-tissue]
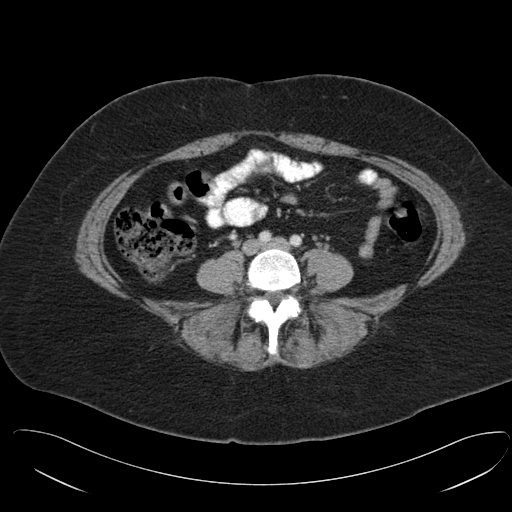
[im 49/98  soft-tissue]
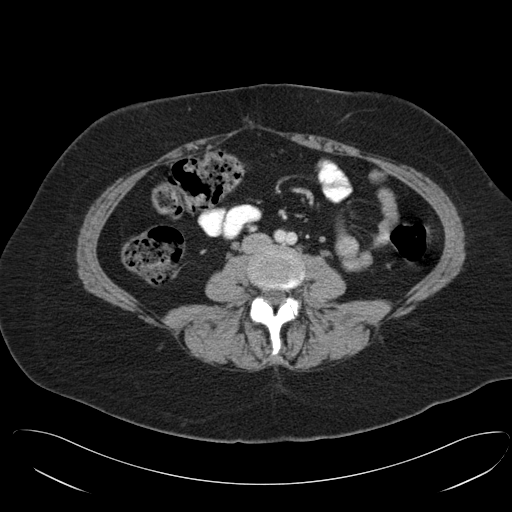
[im 54/98  soft-tissue]
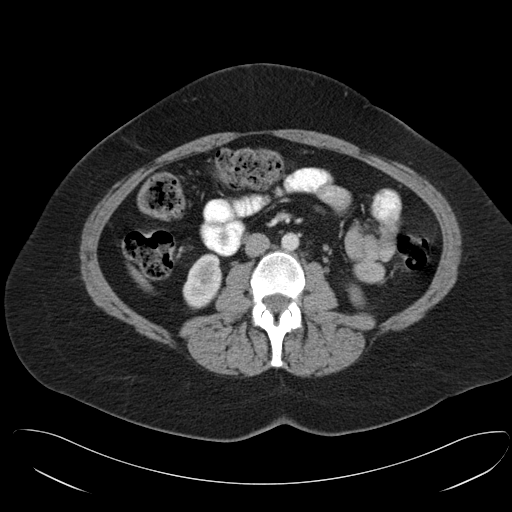
[im 64/98  soft-tissue]
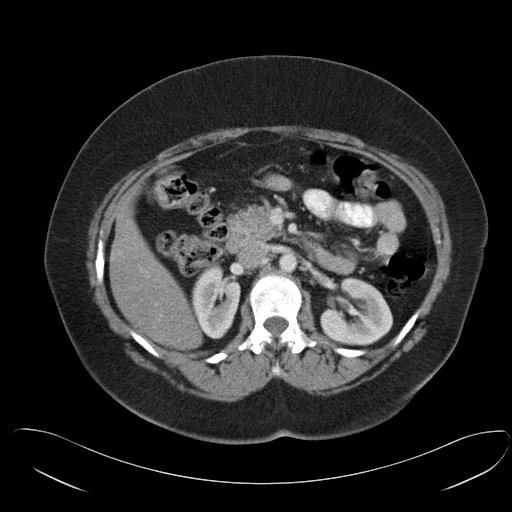
[im 64/98  bone]
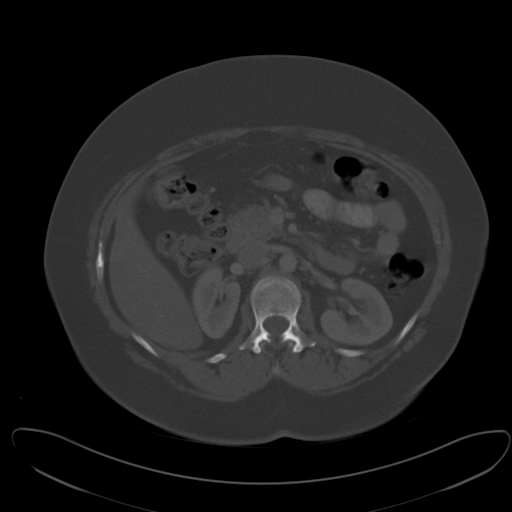
[im 68/98  soft-tissue]
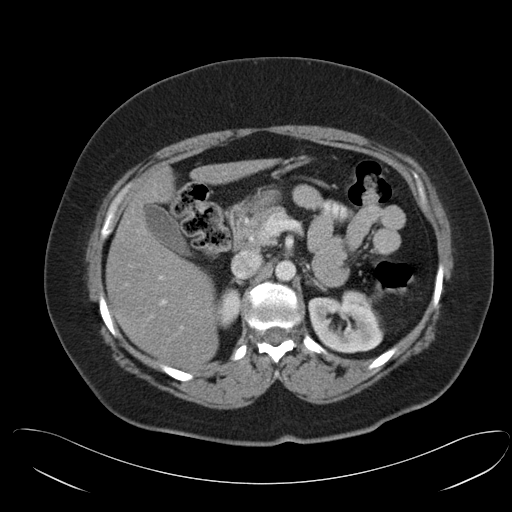
[im 78/98  soft-tissue]
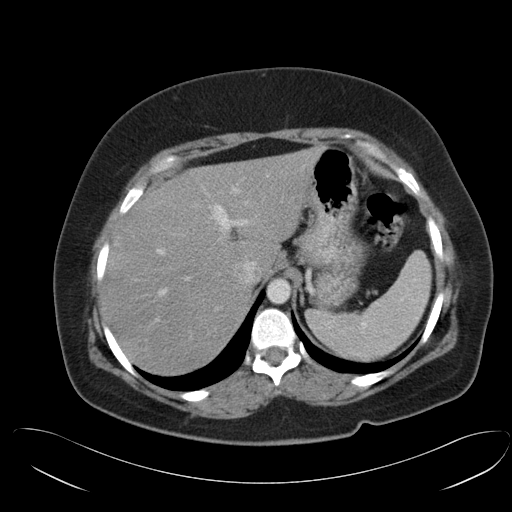
[im 78/98  lung]
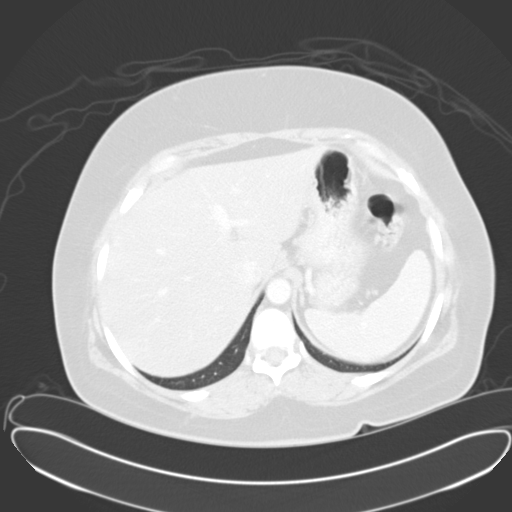
[im 83/98  soft-tissue]
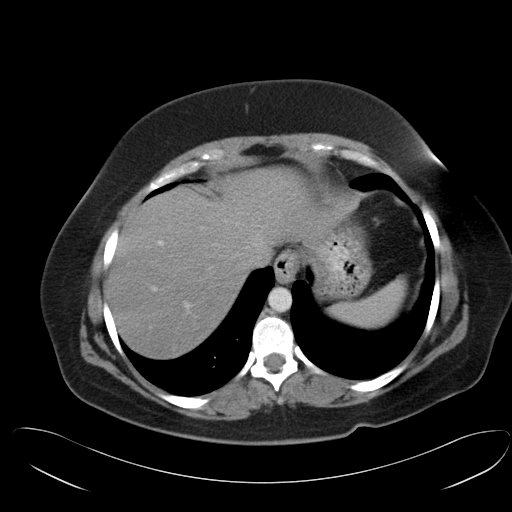
[im 83/98  lung]
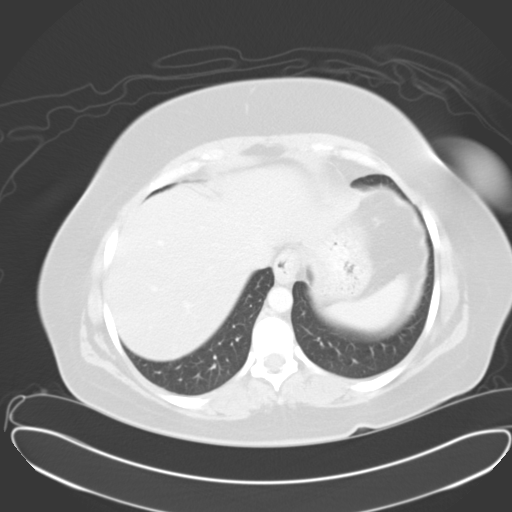
[im 88/98  lung]
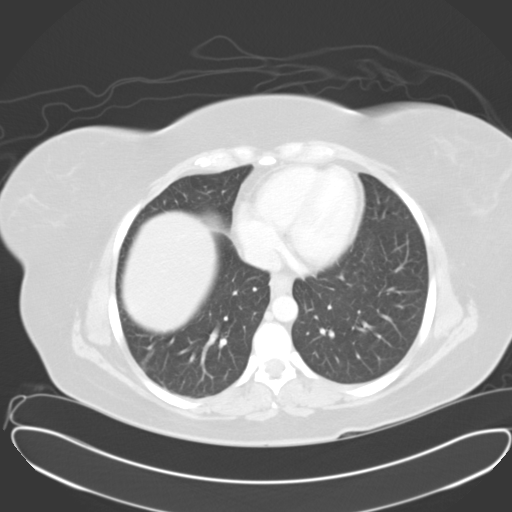
[im 93/98  soft-tissue]
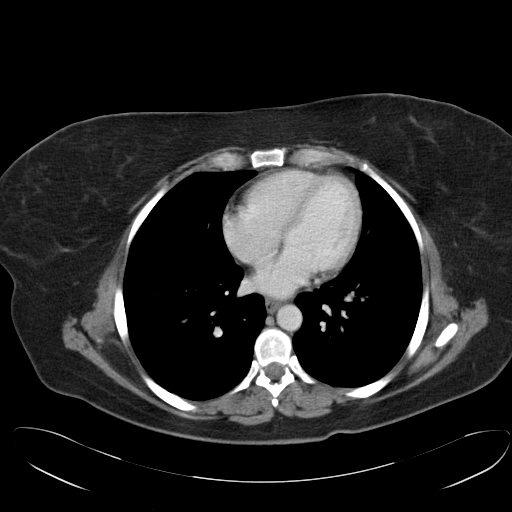
[im 93/98  lung]
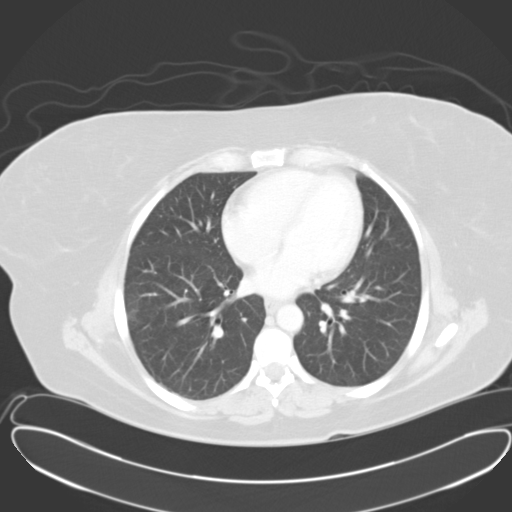

[14 of 32 positions shown; findings below may reference images not displayed]

FINDINGS: The lung bases are clear.  Minimal esophageal hiatal hernia.

Diffuse fatty infiltration of the liver. No focal liver lesions
identified. The gallbladder, spleen, pancreas, adrenal glands,
kidneys, abdominal aorta, inferior vena cava, and retroperitoneal
lymph nodes are unremarkable. Stomach, small bowel, and colon are
not abnormally distended. No free air or free fluid in the abdomen.

Pelvis: The appendix is normal. Uterus and ovaries are not enlarged.
No free or loculated pelvic fluid collections. No pelvic mass or
lymphadenopathy. Bladder wall is not thickened. No destructive bone
lesions.
IMPRESSION: Diffuse fatty infiltration of the liver. No acute process
demonstrated in the abdomen or pelvis. No evidence of bowel
obstruction or inflammation.

## 2017-09-19 ENCOUNTER — Emergency Department: Payer: Self-pay

## 2017-09-19 ENCOUNTER — Other Ambulatory Visit: Payer: Self-pay

## 2017-09-19 ENCOUNTER — Emergency Department
Admission: EM | Admit: 2017-09-19 | Discharge: 2017-09-19 | Disposition: A | Discharge status: Home / Self Care | Payer: Self-pay | Attending: Emergency Medicine | Admitting: Emergency Medicine

## 2017-09-19 DIAGNOSIS — Z79899 Other long term (current) drug therapy: Secondary | ICD-10-CM | POA: Insufficient documentation

## 2017-09-19 DIAGNOSIS — Y93K9 Activity, other involving animal care: Secondary | ICD-10-CM | POA: Insufficient documentation

## 2017-09-19 DIAGNOSIS — Y929 Unspecified place or not applicable: Secondary | ICD-10-CM | POA: Insufficient documentation

## 2017-09-19 DIAGNOSIS — S6982XA Other specified injuries of left wrist, hand and finger(s), initial encounter: Secondary | ICD-10-CM | POA: Insufficient documentation

## 2017-09-19 DIAGNOSIS — S6992XA Unspecified injury of left wrist, hand and finger(s), initial encounter: Secondary | ICD-10-CM

## 2017-09-19 DIAGNOSIS — F17210 Nicotine dependence, cigarettes, uncomplicated: Secondary | ICD-10-CM | POA: Insufficient documentation

## 2017-09-19 DIAGNOSIS — X58XXXA Exposure to other specified factors, initial encounter: Secondary | ICD-10-CM | POA: Insufficient documentation

## 2017-09-19 DIAGNOSIS — Y999 Unspecified external cause status: Secondary | ICD-10-CM | POA: Insufficient documentation

## 2017-09-19 DIAGNOSIS — N390 Urinary tract infection, site not specified: Secondary | ICD-10-CM | POA: Insufficient documentation

## 2017-09-19 LAB — URINALYSIS, COMPLETE (UACMP) WITH MICROSCOPIC
Bacteria, UA: NONE SEEN
Bilirubin Urine: NEGATIVE
GLUCOSE, UA: NEGATIVE mg/dL
Ketones, ur: NEGATIVE mg/dL
NITRITE: POSITIVE — AB
PROTEIN: NEGATIVE mg/dL
SPECIFIC GRAVITY, URINE: 1.016 (ref 1.005–1.030)
pH: 6 (ref 5.0–8.0)

## 2017-09-19 MED ORDER — MELOXICAM 15 MG PO TABS: 15.0000 mg | ORAL_TABLET | Freq: Every day | ORAL | 0 refills | Status: DC

## 2017-09-19 MED ORDER — SULFAMETHOXAZOLE-TRIMETHOPRIM 800-160 MG PO TABS: 1.0000 | ORAL_TABLET | Freq: Two times a day (BID) | ORAL | 0 refills | Status: DC

## 2017-09-19 MED ORDER — SULFAMETHOXAZOLE-TRIMETHOPRIM 800-160 MG PO TABS
1.0000 | ORAL_TABLET | Freq: Once | ORAL | Status: AC
  Administered 2017-09-19: 1 via ORAL
  Filled 2017-09-19: qty 1

## 2017-09-19 MED ORDER — FLUCONAZOLE 150 MG PO TABS: 150.0000 mg | ORAL_TABLET | Freq: Once | ORAL | 0 refills | Status: AC

## 2017-09-19 MED ORDER — ONDANSETRON 4 MG PO TBDP: 4.0000 mg | ORAL_TABLET | Freq: Three times a day (TID) | ORAL | 0 refills | Status: DC | PRN

## 2017-09-19 NOTE — ED Notes (Signed)
Pt is A&Ox4, in NAD.  Ambulatory from triage.  Pt reports urinary frequency/urgency and pain at the end of urination.  Pt also having reports of pain in finger when she hit it while walking horse a week ago.

## 2017-09-19 NOTE — ED Provider Notes (Signed)
[facility] Emergency Department Provider Note  ____________________________________________  Time seen: Approximately 7:48 PM  I have reviewed the triage vital signs and the nursing notes.   HISTORY  Chief Complaint Urinary Frequency    HPI Sandra Cantrell is a 52 y.o. female who presents the emergency department with 2 complaints.  Patient reports that she has symptoms consistent with a UTI.  Patient reports that she has urinary urgency, frequency, dysuria when emptying bladder.  Patient denies any flank pain, hematuria.  Patient reports that she had a bladder outlet obstruction in the distant past, this was surgically dilated with no further issues.  Patient reports that she has not had any recent UTIs.  No medications for this complaint prior to arrival.  Symptoms have been ongoing for the past 2 to 3 days.  Patient is also complaining of pain to the third digit of her left hand.  Patient reports that she was taking care of her horse when her finger came injured.  This was a week ago but continues to cause pain.  Patient is able to flex and extend the digit.  She reports some mild edema which improves after wrapping her digit with self-adhesive wrap.  No medications for this complaint.  No other complaints at this time.  Past Medical History:  Diagnosis Date  . Anxiety   . Depression   . Multiple allergies   . Post traumatic stress disorder (PTSD)   . Stress headaches     Patient Active Problem List   Diagnosis Date Noted  . Post traumatic stress disorder (PTSD) 09/05/2015  . PTSD (post-traumatic stress disorder)   . Abdominal pain 09/04/2015    Past Surgical History:  Procedure Laterality Date  . DILATION AND CURETTAGE OF UTERUS    . ESOPHAGOGASTRODUODENOSCOPY N/A 09/05/2015   Procedure: ESOPHAGOGASTRODUODENOSCOPY (EGD);  Surgeon: Scot Jun, MD;  Location: [facility];  Service: Endoscopy;  Laterality: N/A;  . GANGLION CYST EXCISION Left  02/13/2015   Procedure: REMOVAL GANGLION OF WRIST;  Surgeon: Betha Loa, MD;  Location: [facility];  Service: Orthopedics;  Laterality: Left;  . TONSILLECTOMY    . TUBAL LIGATION    . WRIST ARTHROSCOPY Left 02/13/2015   Procedure: LEFT ARTHROSCOPY WRIST VOLAR GANGLION EXCISION;  Surgeon: Betha Loa, MD;  Location: [facility];  Service: Orthopedics;  Laterality: Left;    Prior to Admission medications   Medication Sig Start Date End Date Taking? Authorizing Provider  acetaminophen-codeine (TYLENOL #3) 300-30 MG tablet Take 1 tablet by mouth every 8 (eight) hours as needed for moderate pain. 09/21/16   Menshew, Charlesetta Ivory, PA-C  aspirin-acetaminophen-caffeine (EXCEDRIN MIGRAINE) [phone] MG tablet Take 1 tablet by mouth every 6 (six) hours as needed for headache.    [provider]  benzonatate (TESSALON PERLES) 100 MG capsule Take 1 capsule (100 mg total) by mouth 3 (three) times daily as needed for cough (Take 1-2 per dose). 09/21/16   Menshew, Charlesetta Ivory, PA-C  Brexpiprazole 1 MG TABS Take 2 tablets by mouth daily.    [provider]  calcium carbonate (TUMS - DOSED IN MG ELEMENTAL CALCIUM) 500 MG chewable tablet Chew 1 tablet by mouth as needed for indigestion or heartburn.    [provider]  erythromycin ophthalmic ointment Place 1 application into the right eye 4 (four) times daily. 09/21/16   Menshew, Charlesetta Ivory, PA-C  FLUoxetine (PROZAC) 20 MG capsule Take 1 capsule (20 mg total) by mouth daily.  09/06/15   Houston Siren, MD  meloxicam (MOBIC) 15 MG tablet Take 1 tablet (15 mg total) by mouth daily. 09/19/17   Dartanian Knaggs, Delorise Royals, PA-C  ondansetron (ZOFRAN ODT) 4 MG disintegrating tablet Take 1 tablet (4 mg total) by mouth every 6 (six) hours as needed for nausea or vomiting. 10/12/15   Sharyn Creamer, MD  pantoprazole (PROTONIX) 40 MG tablet Take 1 tablet (40 mg total) by mouth 2 (two) times daily. 09/06/15   Houston Siren, MD  predniSONE (DELTASONE) 10 MG tablet Take 1 tablet (10 mg total) by mouth 2 (two) times daily with a meal. 09/21/16   Menshew, Charlesetta Ivory, PA-C  sucralfate (CARAFATE) 1 GM/10ML suspension Take 10 mLs (1 g total) by mouth 4 (four) times daily -  with meals and at bedtime. 09/06/15   Houston Siren, MD  sulfamethoxazole-trimethoprim (BACTRIM DS,SEPTRA DS) 800-160 MG tablet Take 1 tablet by mouth 2 (two) times daily. 09/19/17   Yvan Dority, Delorise Royals, PA-C    Allergies Penicillins  Family History  Problem Relation Age of Onset  . Diabetes Mellitus II Father   . Diabetes Mellitus II Mother   . Breast cancer Maternal Aunt   . CAD Maternal Grandfather     Social History Social History   Tobacco Use  . Smoking status: Current Every Day Smoker    Packs/day: 0.75    Types: Cigarettes  . Smokeless tobacco: Never Used  Substance Use Topics  . Alcohol use: No    Alcohol/week: 0.0 oz  . Drug use: No     Review of Systems  Constitutional: No fever/chills Eyes: No visual changes.  Cardiovascular: no chest pain. Respiratory: no cough. No SOB. Gastrointestinal: No abdominal pain.  No nausea, no vomiting.  No diarrhea.  No constipation. Genitourinary: Positive for urgency, dysuria, polyuria.. No hematuria. Musculoskeletal: Positive for pain to the third digit of the left hand Skin: Negative for rash, abrasions, lacerations, ecchymosis. Neurological: Negative for headaches, focal weakness or numbness. 10-point ROS otherwise negative.  ____________________________________________   PHYSICAL EXAM:  VITAL SIGNS: ED Triage Vitals [09/19/17 1915]  Enc Vitals Group     BP 112/62     Pulse Rate 92     Resp 18     Temp 99.5 F (37.5 C)     Temp Source Oral     SpO2 100 %     Weight 210 lb (95.3 kg)     Height  (1.651 m)     Head Circumference      Peak Flow      Pain Score 2     Pain Loc      Pain Edu?      Excl. in GC?      Constitutional: Alert and oriented.  Well appearing and in no acute distress. Eyes: Conjunctivae are normal. PERRL. EOMI. Head: Atraumatic. Neck: No stridor.    Cardiovascular: Normal rate, regular rhythm. Normal S1 and S2.  Good peripheral circulation. Respiratory: Normal respiratory effort without tachypnea or retractions. Lungs CTAB. Good air entry to the bases with no decreased or absent breath sounds. Gastrointestinal: Bowel sounds 4 quadrants. Soft and nontender to palpation all quadrants.  Mild tenderness over the suprapubic region.. No guarding or rigidity. No palpable masses. No distention. No CVA tenderness. Musculoskeletal: Full range of motion to all extremities. No gross deformities appreciated.  No gross deformity or edema to the third digit on the left hand.  Very minimal edema noted over the PIP joint.  Patient is able to flex and extend the digit appropriately but doing so causes pain to the PIP joint.  Patient is very tender to palpation of the PIP joint with no palpable abnormality.  Sensation and cap refill intact distally. Neurologic:  Normal speech and language. No gross focal neurologic deficits are appreciated.  Skin:  Skin is warm, dry and intact. No rash noted. Psychiatric: Mood and affect are normal. Speech and behavior are normal. Patient exhibits appropriate insight and judgement.   ____________________________________________   LABS (all labs ordered are listed, but only abnormal results are displayed)  Labs Reviewed  URINALYSIS, COMPLETE (UACMP) WITH MICROSCOPIC - Abnormal; Notable for the following components:      Result Value   Color, Urine YELLOW (*)    APPearance HAZY (*)    Hgb urine dipstick LARGE (*)    Nitrite POSITIVE (*)    Leukocytes, UA LARGE (*)    WBC, UA >50 (*)    All other components within normal limits   ____________________________________________  EKG   ____________________________________________  RADIOLOGY Festus Barren Nycholas Rayner, personally viewed and  evaluated these images (plain radiographs) as part of my medical decision making, as well as reviewing the written report by the radiologist.  Dg Finger Middle Left  Result Date: 09/19/2017 CLINICAL DATA:  Left middle finger pain EXAM: LEFT MIDDLE FINGER 2+V COMPARISON:  None. FINDINGS: There is no evidence of fracture or dislocation. There is no evidence of arthropathy or other focal bone abnormality. Soft tissues are unremarkable. IMPRESSION: Negative. Electronically Signed   By: Jasmine Pang M.D.   On: 09/19/2017 19:59    ____________________________________________    PROCEDURES  Procedure(s) performed:    .Splint Application Date/Time: 09/19/2017 8:49 PM Performed by: Racheal Patches, PA-C Authorized by: Racheal Patches, PA-C   Consent:    Consent obtained:  Verbal   Consent given by:  Patient   Risks discussed:  Pain and swelling Pre-procedure details:    Sensation:  Normal Procedure details:    Laterality:  Left   Location:  Finger   Finger:  L long finger   Splint type:  Finger   Supplies:  Aluminum splint and elastic bandage Post-procedure details:    Pain:  Improved   Sensation:  Normal   Patient tolerance of procedure:  Tolerated well, no immediate complications      Medications  sulfamethoxazole-trimethoprim (BACTRIM DS,SEPTRA DS) 800-160 MG per tablet 1 tablet (has no administration in time range)     ____________________________________________   INITIAL IMPRESSION / ASSESSMENT AND PLAN / ED COURSE  Pertinent labs & imaging results that were available during my care of the patient were reviewed by me and considered in my medical decision making (see chart for details).  Review of the [place] CS[place] was performed in accordance of the [facility] prior to dispensing any controlled drugs.     Patient's diagnosis is consistent with jammed third digit proximal interphalangeal joint, UTI.  Patient presents with 2 complaints.  X-ray reveals no acute  osseous abnormality to the third digit.  Patient's finger is splinted for comfort.  Follow-up with orthopedics as necessary.  Patient also reports with dysuria, urgency, polyuria.  Urinalysis returns with results consistent with UTI.  Patient is allergic to penicillins and as such will be placed on Bactrim.. Patient will be discharged home with prescriptions for Bactrim and meloxicam. Patient is to follow up with primary care and/or orthopedics as needed or otherwise directed. Patient is given ED precautions to return to  the ED for any worsening or new symptoms.     ____________________________________________  FINAL CLINICAL IMPRESSION(S) / ED DIAGNOSES  Final diagnoses:  Jammed interphalangeal joint of finger of left hand, initial encounter  Lower urinary tract infectious disease      NEW MEDICATIONS STARTED DURING THIS VISIT:  ED Discharge Orders        Ordered    sulfamethoxazole-trimethoprim (BACTRIM DS,SEPTRA DS) 800-160 MG tablet  2 times daily     09/19/17 2052    meloxicam (MOBIC) 15 MG tablet  Daily     09/19/17 2052          This chart was dictated using voice recognition software/Dragon. Despite best efforts to proofread, errors can occur which can change the meaning. Any change was purely unintentional.    Racheal Patches, PA-C 09/19/17 2052    Nita Sickle, MD 09/22/17 [phone]

## 2017-09-19 NOTE — ED Notes (Signed)
Patient report pain in left middle finger when trying to bend it. No swelling or deformity noted.

## 2017-09-19 NOTE — ED Notes (Signed)
Pt discharged to home.  Family member driving.  Discharge instructions reviewed.  Verbalized understanding.  No questions or concerns at this time.  Teach back verified.  Pt in NAD.  No items left in ED.   

## 2017-09-19 NOTE — ED Triage Notes (Addendum)
Pt in with urinary urgency and pain at end of urination. Denies any discharge, also co left 3rd finger pain, states injured it while walking a horse over a week ago.

## 2022-06-30 ENCOUNTER — Other Ambulatory Visit: Payer: Self-pay

## 2022-06-30 ENCOUNTER — Emergency Department
Admission: EM | Admit: 2022-06-30 | Discharge: 2022-06-30 | Disposition: A | Discharge status: Home / Self Care | Payer: Self-pay | Attending: Emergency Medicine | Admitting: Emergency Medicine

## 2022-06-30 DIAGNOSIS — K029 Dental caries, unspecified: Secondary | ICD-10-CM | POA: Insufficient documentation

## 2022-06-30 DIAGNOSIS — R590 Localized enlarged lymph nodes: Secondary | ICD-10-CM | POA: Insufficient documentation

## 2022-06-30 DIAGNOSIS — K0889 Other specified disorders of teeth and supporting structures: Secondary | ICD-10-CM

## 2022-06-30 MED ORDER — CLINDAMYCIN HCL 300 MG PO CAPS: 300.0000 mg | ORAL_CAPSULE | Freq: Three times a day (TID) | ORAL | 0 refills | Status: AC

## 2022-06-30 MED ORDER — KETOROLAC TROMETHAMINE 15 MG/ML IJ SOLN
15.0000 mg | Freq: Once | INTRAMUSCULAR | Status: AC
  Administered 2022-06-30: 15 mg via INTRAMUSCULAR
  Filled 2022-06-30: qty 1

## 2022-06-30 MED ORDER — OXYCODONE HCL 5 MG PO TABS
5.0000 mg | ORAL_TABLET | Freq: Once | ORAL | Status: AC
  Administered 2022-06-30: 5 mg via ORAL
  Filled 2022-06-30: qty 1

## 2022-06-30 MED ORDER — CHLORHEXIDINE GLUCONATE 0.12 % MT SOLN: 15.0000 mL | Freq: Two times a day (BID) | OROMUCOSAL | 0 refills | Status: DC

## 2022-06-30 NOTE — ED Provider Notes (Signed)
[facility] Provider Note    Event Date/Time   First MD Initiated Contact with Patient 06/30/22 1810     (approximate)   History   Dental Pain and facial numbness (since 14hr)   HPI  Sandra Cantrell is a 57 y.o. female with a past medical history of PTSD who presents today for evaluation of left-sided dental pain.  Patient reports that this began at 1 AM this morning.  She reports that she has also noticed pain right below her jaw.  She took Excedrin prior to arrival.  She denies any trouble swallowing.  She has not noticed any facial swelling or erythema.  She reports that she knows that she has bad teeth but has not seen a dentist.  No fever or chills.  Patient Active Problem List   Diagnosis Date Noted   Post traumatic stress disorder (PTSD) 09/05/2015   PTSD (post-traumatic stress disorder)    Abdominal pain 09/04/2015          Physical Exam   Triage Vital Signs: ED Triage Vitals  Enc Vitals Group     BP 06/30/22 1719 128/69     Pulse Rate 06/30/22 1719 79     Resp 06/30/22 1719 15     Temp 06/30/22 1719 98.4 F (36.9 C)     Temp Source 06/30/22 1719 Oral     SpO2 06/30/22 1719 99 %     Weight 06/30/22 1720 275 lb (124.7 kg)     Height 06/30/22 1720 5\' 5"  (1.651 m)     Head Circumference --      Peak Flow --      Pain Score 06/30/22 1719 9     Pain Loc --      Pain Edu? --      Excl. in GC? --     Most recent vital signs: Vitals:   06/30/22 1719  BP: 128/69  Pulse: 79  Resp: 15  Temp: 98.4 F (36.9 C)  SpO2: 99%    Physical Exam Vitals and nursing note reviewed.  Constitutional:      General: Awake and alert. No acute distress.    Appearance: Normal appearance. The patient is normal weight.  HENT:     Head: Normocephalic and atraumatic.     Mouth: Mucous membranes are moist.  Diffusely poor dentition with multiple dental caries and dental decay.  Left posterior molar with large filling which appears to be intact.  No  gingival swelling or fluctuance noted.  No sublingual swelling.  No facial or neck swelling or erythema.  Mild left-sided cervical lymphadenopathy noted.  Normal range of motion of neck, no nuchal rigidity.  No trismus. Eyes:     General: PERRL. Normal EOMs        Right eye: No discharge.        Left eye: No discharge.     Conjunctiva/sclera: Conjunctivae normal.  Cardiovascular:     Rate and Rhythm: Normal rate and regular rhythm.     Pulses: Normal pulses.  Pulmonary:     Effort: Pulmonary effort is normal. No respiratory distress.     Breath sounds: Normal breath sounds.  Abdominal:     Abdomen is soft. There is no abdominal tenderness. No rebound or guarding. No distention. Musculoskeletal:        General: No swelling. Normal range of motion.     Cervical back: Normal range of motion and neck supple.  Skin:    General: Skin is warm  and dry.     Capillary Refill: Capillary refill takes less than 2 seconds.     Findings: No rash.  Neurological:     Mental Status: The patient is awake and alert.      ED Results / Procedures / Treatments   Labs (all labs ordered are listed, but only abnormal results are displayed) Labs Reviewed - No data to display   EKG     RADIOLOGY     PROCEDURES:  Critical Care performed:   Procedures   MEDICATIONS ORDERED IN ED: Medications  ketorolac (TORADOL) 15 MG/ML injection 15 mg (15 mg Intramuscular Given 06/30/22 1825)  oxyCODONE (Oxy IR/ROXICODONE) immediate release tablet 5 mg (5 mg Oral Given 06/30/22 1824)     IMPRESSION / MDM / ASSESSMENT AND PLAN / ED COURSE  I reviewed the triage vital signs and the nursing notes.   Differential diagnosis includes, but is not limited to, dental caries, dental decay, pulpitis, less likely abscess.  Patient was evaluated in the emergency department for dental pain. Patient has tenderness over 1 of her teeth and poor dentition, I suspect some dental caries vs pulpitits. No gingival  swelling or fluctuance concerning for gingival abscess.  No trismus, nuchal rigidity, neck pain, hot potato voice, uvular deviation or malocclusion to suggest deep space infection. No sublingual swelling concerning for Ludwig's angina.  Patient was started on antibiotics and chlorhexidine mouth rinse.  Patient was treated symptomatically in the emergency department. Discussed care plan, return precautions, and advised close outpatient follow-up with dentist.  She was given a list of low-cost dental clinics in the area.  Patient agrees with plan of care.   Patient's presentation is most consistent with acute complicated illness / injury requiring diagnostic workup.       FINAL CLINICAL IMPRESSION(S) / ED DIAGNOSES   Final diagnoses:  Pain, dental     Rx / DC Orders   ED Discharge Orders          Ordered    clindamycin (CLEOCIN) 300 MG capsule  3 times daily        06/30/22 1828    chlorhexidine (PERIDEX) 0.12 % solution  2 times daily        06/30/22 1828             Note:  This document was prepared using Dragon voice recognition software and may include unintentional dictation errors.   Keturah Shavers 06/30/22 1853    Concha Se, MD 07/01/22 1106

## 2022-06-30 NOTE — ED Triage Notes (Signed)
Pt to ED for numbness to both sides of anterior upper neck under chin since 0100 today (14 hours ago). Pt has L jaw pain since yesterday with broken tooth to same side.   Also complains of chronic leg and hip pain, too painful to stand for prolonged periods.

## 2022-06-30 NOTE — Discharge Instructions (Signed)
Please take the antibiotics and use the mouth rinse as prescribed.  Please follow-up with a dentist to soon as possible.  Please return for any new, worsening, or change in symptoms or other concerns.  It was a pleasure caring for you today.

## 2022-07-02 DIAGNOSIS — K122 Cellulitis and abscess of mouth: Secondary | ICD-10-CM | POA: Insufficient documentation

## 2022-07-02 DIAGNOSIS — S025XXA Fracture of tooth (traumatic), initial encounter for closed fracture: Secondary | ICD-10-CM | POA: Insufficient documentation

## 2022-07-02 DIAGNOSIS — R7989 Other specified abnormal findings of blood chemistry: Secondary | ICD-10-CM | POA: Insufficient documentation

## 2022-07-09 DIAGNOSIS — K219 Gastro-esophageal reflux disease without esophagitis: Secondary | ICD-10-CM | POA: Insufficient documentation

## 2022-12-29 NOTE — Congregational Nurse Program (Signed)
  Dept: [phone]   Congregational Nurse Program Note  Date of Encounter: 12/29/2022  Client into clinic requesting assistance clarifying Medicaid status and accessing care for her numb mouth and other medical concerns that she has not been able to get help to her satisfaction. "They keep telling me that the numbness will go away eventually" after hospitalization and intubation back in March. States since that hospital stay her thoughts have been mixed up so she isn't sure of timelines but states she completed M'caid application since hospitalization. Applied for food stamps at the same time and got those but no word on M'caid status. Unable to stay today for connection with financial assistance team at [facility] or DSS. States she has left messages at DSS without response.  Reviewed HIPAA with Bonita Quin and she agrees for record review and advocacy regarding clarifying M'caid. Discussed situation with [facility] Navigator. States no record of M'caid. E-mail sent to DSS requesting clarification of status. Appt set for Iveth to return to clinic to follow up further at 11:30 01/05/23. Rhermann, RN  Past Medical History: Past Medical History:  Diagnosis Date   Anxiety    Depression    Multiple allergies    Post traumatic stress disorder (PTSD)    Stress headaches     Encounter Details:  CNP Questionnaire - 12/29/22 1400       Questionnaire   Ask client: Do you give verbal consent for me to treat you today? Yes    Student Assistance N/A    Location Patient Information systems manager, [employer]    Visit Setting with Client Organization    Patient Status Unknown    Insurance Uninsured ([employer] Card/Care Connects/Self-Pay/Medicaid Family Planning)   pending M'caid   Insurance/Financial Assistance Referral Worried about Medical Debt    Medication N/A    Medical Provider Yes    Screening Referrals Made N/A    Medical Referrals Made N/A    Medical Appointment Made N/A     Recently w/o PCP, now 1st time PCP visit completed due to CNs referral or appointment made N/A    Food Have Food Insecurities    Transportation N/A    Housing/Utilities N/A    Economist N/A    Interventions Advocate/Support;Navigate Healthcare System    Abnormal to Normal Screening Since Last CN Visit N/A    Screenings CN Performed N/A    Sent Client to Lab for: N/A    Did client attend any of the following based off CNs referral or appointments made? N/A    ED Visit Averted N/A    Life-Saving Intervention Made N/A

## 2023-01-05 NOTE — Congregational Nurse Program (Signed)
  Dept: [phone]   Congregational Nurse Program Note  Date of Encounter: 01/05/2023 Client into scheduled nurse appt at food pantry for assistance navigating health care system. 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 Client stated that she didn't know why she was intubated and why she had numbness. Reviewed diagnosis with client. Needs insurance. M'caid is still pending; Medical bill from [facility] for [phone]. Reviewed process of applying for charity care. Took application to complete as needs mailing address and other info that she doesn't have with her today. Also took application [facility] for appointment. Client is undecided if she wants to start with the [facility] but agrees that she needs medical follow up soon. Reviewed process for applying for SSI. 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 Subsequently lost housing. Has chronic low back and knee pain. Lives in tent community with friend and family. Has a friend who lets her use his address for food stamps. Client states she was followed at [facility] for  depression, OCD, PTSD but left b/c they were "forcing me to join group for therapy." Not currently taking any meds. To RTC in 1 week with paperwork to finish application process. Rhermann, RN Past Medical History: Past Medical History:  Diagnosis Date   Anxiety    Depression    Multiple allergies    Post traumatic stress disorder (PTSD)    Stress headaches     Encounter Details:  CNP Questionnaire - 01/05/23 1130       Questionnaire   Ask client: Do you give verbal consent for me to treat you today? Yes    Student Assistance N/A    Location Patient Information systems manager, [employer]    Visit Setting with Client Organization    Patient Status Unhoused   lives in tent community   Insurance Uninsured ([employer] Card/Care  Connects/Self-Pay/Medicaid Family Planning)   pending M'caid   Insurance/Financial Assistance Referral Worried about Medical Debt    Medication N/A    Medical Provider No    Screening Referrals Made N/A    Medical Referrals Made [facility]    Medical Appointment Made N/A    Recently w/o PCP, now 1st time PCP visit completed due to CNs referral or appointment made N/A    Food Have Food Insecurities    Transportation N/A    Housing/Utilities No permanent housing;Referred to homeless shelter, day center    Interpersonal Safety N/A    Interventions Advocate/Support;Navigate Healthcare System;Educate    Abnormal to Normal Screening Since Last CN Visit N/A    Screenings CN Performed N/A    Sent Client to Lab for: N/A    Did client attend any of the following based off CNs referral or appointments made? N/A    ED Visit Averted N/A    Life-Saving Intervention Made N/A

## 2023-01-12 NOTE — Congregational Nurse Program (Signed)
  Dept: [phone]   Congregational Nurse Program Note  Date of Encounter: 01/12/2023 Client into nurse only clinic requesting assistance with access to care. States she remains unhoused, living in a tent on private property with others in tents. states that her M'caid is still pending. Discussed [facility] as an option. Hopes to hear from M'caid by the end of the month. Wants to apply for SSI so process started today. Client to proof and complete unfinished portions on her own and follow up with nurse as needed. Has not started working on the charity care application for [facility] yet. Reaffirmed need to complete this paperwork as well. Requested more community resources for food. Referred to the [employer]. No screenings desired today. RTC in 1-2 wks. Rhermannn, RN Past Medical History: Past Medical History:  Diagnosis Date   Anxiety    Depression    Multiple allergies    Post traumatic stress disorder (PTSD)    Stress headaches     Encounter Details:  CNP Questionnaire - 01/12/23 1453       Questionnaire   Ask client: Do you give verbal consent for me to treat you today? Yes    Student Assistance N/A    Location Patient Information systems manager, [employer]    Visit Setting with Client Organization    Patient Status Unhoused   lives in tent community   Insurance Uninsured ([employer] Card/Care Connects/Self-Pay/Medicaid Family Planning)   pending M'caid   Insurance/Financial Assistance Referral Worried about Medical Debt    Medication N/A    Medical Provider No    Screening Referrals Made N/A    Medical Referrals Made [facility]    Medical Appointment Made N/A    Recently w/o PCP, now 1st time PCP visit completed due to CNs referral or appointment made N/A    Food Have Food Insecurities    Transportation N/A    Housing/Utilities No permanent housing;Referred to homeless shelter, day center    Interpersonal Safety N/A    Interventions Advocate/Support;Navigate Healthcare  System;Educate    Abnormal to Normal Screening Since Last CN Visit N/A    Screenings CN Performed N/A    Sent Client to Lab for: N/A    Did client attend any of the following based off CNs referral or appointments made? N/A    ED Visit Averted N/A    Life-Saving Intervention Made N/A

## 2023-01-20 NOTE — Congregational Nurse Program (Signed)
  Dept: [phone]   Congregational Nurse Program Note  Date of Encounter: 01/20/2023 Client into nurse only clinic on 01/19/23 stating that she received a letter that her M'caid was approved but didn't receive her actual card so she doesn't know who her assigned provider is. Nurse connected today with [facility] navigator who stated that in [place] [employer] the client is assigned to [employer] broker with provider [facility]. Text message sent to client to call nurse for the requested info. Rhermann, RN Past Medical History: Past Medical History:  Diagnosis Date   Anxiety    Depression    Multiple allergies    Post traumatic stress disorder (PTSD)    Stress headaches     Encounter Details:

## 2023-01-26 NOTE — Congregational Nurse Program (Signed)
  Dept: [phone]   Congregational Nurse Program Note  Date of Encounter: 01/26/2023  Client into nurse only clinic at food pantry o have help understanding packet she received from [facility]. Assisted client with a call to [facility] broker. Verified that client's M'caid is with [facility] and becomes effective 02/01/23 (standard m'caid) with PCP being [facility] on [address]. Client verbalized understanding and will call to schedule dental and physical ASAP. Discussed that dental appointments are made through a lottery system by calling at the end of the month. Also, client to call insurance company at [phone] if she doesn't receive a welcome packet and M'caid card in the next couple of days. Client to RTC to work on charity care and SSI applications. Declines medical screenings today. Rhermann,RN Past Medical History: Past Medical History:  Diagnosis Date   Anxiety    Depression    Multiple allergies    Post traumatic stress disorder (PTSD)    Stress headaches     Encounter Details:  CNP Questionnaire - 01/26/23 1300       Questionnaire   Ask client: Do you give verbal consent for me to treat you today? Yes    Student Assistance N/A    Location Patient Information systems manager, [employer]    Visit Setting with Hospital doctor    Patient Status Unhoused   lives in tent community   Insurance Medicaid   pending M'caid   Insurance/Financial Assistance Referral Worried about Medical Debt;Charitable Care    Medication N/A    Medical Provider Yes   [facility])   Screening Referrals Made Annual Wellness Visit    Medical Referrals Made N/A    Medical Appointment Made N/A    Recently w/o PCP, now 1st time PCP visit completed due to CNs referral or appointment made N/A    Food Have Food Insecurities    Transportation N/A    Housing/Utilities No permanent housing;Referred to housing/utility  assistance program    Interpersonal Safety N/A    Interventions Advocate/Support;Navigate Healthcare System;Educate    Abnormal to Normal Screening Since Last CN Visit N/A    Screenings CN Performed N/A    Sent Client to Lab for: N/A    Did client attend any of the following based off CNs referral or appointments made? Yes;Insurance    ED Visit Averted N/A    Life-Saving Intervention Made N/A

## 2023-03-23 NOTE — Congregational Nurse Program (Signed)
  Dept: [phone]   Congregational Nurse Program Note  Date of Encounter: 03/23/2023  Client into nurse only clinic at the pantry requesting help with navigating health system to access vision and dental services. States she waited a month for an appointment for glasses then the office called and cancelled saying that they no longer took medicaid. C/o ongoing headache and dental care needs. Followed up from last visit by going to urgent care and has an appointment for initial Medicaid physical at the end of November. Update on housing that she lives in house "couch surfing" while looking for something more permanent but at least out of the weather. Son and his girlfriend live in a tent. She's worried about them and trying to find a camper to rent to bring them all together. Called insurance company to access vision and dental providers. Assisted setting an appointment with [facility] for 04/06/23 at 2:30. Gave client 3 approved general dentist clinics to attempt to schedule dental care. Client to schedule. Reinforced upcoming appointments. Client declines screenings today. RTC prn. Rhermann, RN Past Medical History: Past Medical History:  Diagnosis Date   Anxiety    Depression    Multiple allergies    Post traumatic stress disorder (PTSD)    Stress headaches     Encounter Details:  Community Questionnaire - 03/23/23 1519       Questionnaire   Ask client: Do you give verbal consent for me to treat you today? Yes    Student Assistance N/A    Location Patient Information systems manager, [employer]    Encounter Setting CN site    [employer] Status Unknown    Insurance Medicaid    Insurance/Financial Assistance Referral N/A    Medication N/A    Medical Provider Yes    Screening Referrals Made N/A    Medical Referrals Made Vision;Dental    Medical Appointment Completed Urgent Care    CNP Interventions [place] [employer] [facility];Case Management;Educate    Screenings CN  Performed N/A    ED Visit Averted N/A    Life-Saving Intervention Made N/A               Dept: [phone]   Congregational Nurse Program Note  Date of Encounter: 03/23/2023  Past Medical History: Past Medical History:  Diagnosis Date   Anxiety    Depression    Multiple allergies    Post traumatic stress disorder (PTSD)    Stress headaches     Encounter Details:  Community Questionnaire - 03/23/23 1519       Questionnaire   Ask client: Do you give verbal consent for me to treat you today? Yes    Student Assistance N/A    Location Patient Information systems manager, [employer]    Encounter Setting CN site    [employer] Status Unknown    Insurance Medicaid    Insurance/Financial Assistance Referral N/A    Medication N/A    Medical Provider Yes    Screening Referrals Made N/A    Medical Referrals Made Vision;Dental    Medical Appointment Completed Urgent Care    CNP Interventions [place] [employer] [facility];Case Management;Educate    Screenings CN Performed N/A    ED Visit Averted N/A    Life-Saving Intervention Made N/A

## 2023-04-05 ENCOUNTER — Other Ambulatory Visit: Payer: Self-pay | Admitting: Nurse Practitioner

## 2023-04-05 DIAGNOSIS — Z1231 Encounter for screening mammogram for malignant neoplasm of breast: Secondary | ICD-10-CM

## 2023-04-13 NOTE — Congregational Nurse Program (Signed)
  Dept: [phone]   Congregational Nurse Program Note  Date of Encounter: 04/13/2023  Client into nurse only clinic at food pantry. Declines screenings today. States her housing remains unstable and that their electricity has been cut off. Discussed with Office manager and provided application for the person who has the power in their name to complete and arrange to follow up with SA staff. Also referred to Electricity assistance program at DSS. Follow up to previous referrals. Client had physical exam and has been referred to [facility]. States she has contact info and will call nurse  if assistance is needed to access appointment. Admits that she has fallen twice in the past week. Doesn't have a cane..This nurse will assure one is available next week when client returns for food pantry pick up. States needs more food than accessed at [employer]. Alternative food pantry sites provided. Client has talked with doctor about needing mental health assistance that is not [facility] and she will follow up with instructions her Dr. Provided. Plans to follow up with Disability application in January after medical referrals completed. Follow up in this clinic prn for health care navigation assistance prn. Rhermann, RN Past Medical History: Past Medical History:  Diagnosis Date   Anxiety    Depression    Multiple allergies    Post traumatic stress disorder (PTSD)    Stress headaches     Encounter Details:  Community Questionnaire - 04/13/23 1400       Questionnaire   Ask client: Do you give verbal consent for me to treat you today? Yes    Student Assistance N/A    Location Patient Information systems manager, [employer]    Encounter Setting CN site    [employer] Status Unknown    Insurance Medicaid    Insurance/Financial Assistance Referral N/A    Medication N/A    Medical Provider Yes    Screening Referrals Made N/A    Medical Referrals Made N/A    Medical Appointment  Completed [facility] PCP/Clinic;Vision    CNP Interventions Navigate Healthcare System;Educate    Screenings CN Performed N/A    ED Visit Averted N/A    Life-Saving Intervention Made N/A

## 2023-05-17 ENCOUNTER — Ambulatory Visit
Admission: RE | Admit: 2023-05-17 | Discharge: 2023-05-17 | Disposition: A | Discharge status: Home / Self Care | Payer: Medicaid Other | Source: Ambulatory Visit | Attending: Nurse Practitioner | Admitting: Nurse Practitioner

## 2023-05-17 DIAGNOSIS — Z1231 Encounter for screening mammogram for malignant neoplasm of breast: Secondary | ICD-10-CM

## 2023-06-01 LAB — GLUCOSE, POCT (MANUAL RESULT ENTRY): POC Glucose: 197 mg/dL — AB (ref 70–99)

## 2023-06-01 NOTE — Congregational Nurse Program (Signed)
Dept: [phone]   Congregational Nurse Program Note  Date of Encounter: 06/01/2023 Client into nurse only clinic requesting assistance with accessing ortho, GI and nutrition referrals that doctor discussed with her at November and December appointments. Client has attempted to access appt with [facility] but they say they don't have referral. Call to [facility] clinic to attempt to clarify. Referrals were made. Nurse is working with clinic nurses to set up GI and Ortho. Client is most eager to get ortho appt to help get pain in low back and both hips managed. States due to recent labs, dr told her not to take tylenol or ibuprofen. Discussed non medication pain management techniques. Called and scheduled nutrition appt for tomorrow to address dx of pre diabetes and high cholesterol. Glucose 197 non fasting (had eaten within the past 1 1/2 hours). Reinforced appt to address labs. Follow up with nurse clinic in 1 week. Rhermann, RN  Past Medical History: Past Medical History:  Diagnosis Date   Anxiety    Depression    Multiple allergies    Post traumatic stress disorder (PTSD)    Stress headaches     Encounter Details:  Community Questionnaire - 06/01/23 1400       Questionnaire   Ask client: Do you give verbal consent for me to treat you today? Yes    Student Assistance N/A    Location Patient Information systems manager, [employer]    Encounter Setting CN site    [employer] Status Unknown    Insurance Medicaid    Insurance/Financial Assistance Referral N/A    Medication N/A    Medical Provider Yes    Screening Referrals Made N/A    Medical Referrals Made [facility] PCP/Clinic    Medical Appointment Completed N/A    CNP Interventions Navigate Healthcare System;Educate    Screenings CN Performed Blood Pressure    ED Visit Averted N/A    Life-Saving Intervention Made N/A               Dept: [phone]   Congregational Nurse Program Note  Date of  Encounter: 06/01/2023  Past Medical History: Past Medical History:  Diagnosis Date   Anxiety    Depression    Multiple allergies    Post traumatic stress disorder (PTSD)    Stress headaches     Encounter Details:  Community Questionnaire - 06/01/23 1400       Questionnaire   Ask client: Do you give verbal consent for me to treat you today? Yes    Student Assistance N/A    Location Patient Information systems manager, [employer]    Encounter Setting CN site    [employer] Status Unknown    Insurance Medicaid    Insurance/Financial Assistance Referral N/A    Medication N/A    Medical Provider Yes    Screening Referrals Made N/A    Medical Referrals Made [facility] PCP/Clinic    Medical Appointment Completed N/A    CNP Interventions Navigate Healthcare System;Educate    Screenings CN Performed Blood Pressure    ED Visit Averted N/A    Life-Saving Intervention Made N/A               Dept: [phone]   Congregational Nurse Program Note  Date of Encounter: 06/01/2023  Past Medical History: Past Medical History:  Diagnosis Date   Anxiety    Depression    Multiple allergies    Post traumatic stress disorder (PTSD)    Stress headaches  Encounter Details:  Community Questionnaire - 06/01/23 1400       Questionnaire   Ask client: Do you give verbal consent for me to treat you today? Yes    Student Assistance N/A    Location Patient Information systems manager, [employer]    Encounter Setting CN site    [employer] Status Unknown    Insurance Medicaid    Insurance/Financial Assistance Referral N/A    Medication N/A    Medical Provider Yes    Screening Referrals Made N/A    Medical Referrals Made [facility] PCP/Clinic    Medical Appointment Completed N/A    CNP Interventions Navigate Healthcare System;Educate    Screenings CN Performed Blood Pressure    ED Visit Averted N/A    Life-Saving Intervention Made N/A

## 2023-06-14 DIAGNOSIS — F32A Depression, unspecified: Secondary | ICD-10-CM | POA: Insufficient documentation

## 2023-06-22 ENCOUNTER — Other Ambulatory Visit: Payer: Self-pay | Admitting: Orthopedic Surgery

## 2023-06-22 DIAGNOSIS — M4807 Spinal stenosis, lumbosacral region: Secondary | ICD-10-CM

## 2023-07-03 ENCOUNTER — Ambulatory Visit
Admission: RE | Admit: 2023-07-03 | Discharge: 2023-07-03 | Disposition: A | Discharge status: Home / Self Care | Payer: Medicaid Other | Source: Ambulatory Visit | Attending: Orthopedic Surgery | Admitting: Orthopedic Surgery

## 2023-07-03 DIAGNOSIS — M4807 Spinal stenosis, lumbosacral region: Secondary | ICD-10-CM | POA: Insufficient documentation

## 2023-08-22 ENCOUNTER — Other Ambulatory Visit: Payer: Self-pay | Admitting: Otolaryngology

## 2023-08-23 ENCOUNTER — Encounter: Payer: Self-pay | Admitting: Otolaryngology

## 2023-08-26 NOTE — Discharge Instructions (Signed)

## 2023-08-31 ENCOUNTER — Ambulatory Visit: Admitting: Anesthesiology

## 2023-08-31 ENCOUNTER — Ambulatory Visit
Admission: RE | Admit: 2023-08-31 | Discharge: 2023-08-31 | Disposition: A | Discharge status: Home / Self Care | Attending: Otolaryngology | Admitting: Otolaryngology

## 2023-08-31 ENCOUNTER — Encounter: Payer: Self-pay | Admitting: Otolaryngology

## 2023-08-31 ENCOUNTER — Other Ambulatory Visit: Payer: Self-pay

## 2023-08-31 ENCOUNTER — Encounter
Admission: RE | Disposition: A | Discharge status: Home / Self Care | Payer: Self-pay | Source: Home / Self Care | Attending: Otolaryngology

## 2023-08-31 DIAGNOSIS — J329 Chronic sinusitis, unspecified: Secondary | ICD-10-CM | POA: Diagnosis not present

## 2023-08-31 DIAGNOSIS — J320 Chronic maxillary sinusitis: Secondary | ICD-10-CM | POA: Diagnosis present

## 2023-08-31 HISTORY — PX: MAXILLARY ANTROSTOMY: SHX2003

## 2023-08-31 HISTORY — DX: Gastro-esophageal reflux disease without esophagitis: K21.9

## 2023-08-31 HISTORY — DX: Other symptoms and signs involving the musculoskeletal system: R29.898

## 2023-08-31 HISTORY — DX: Prediabetes: R73.03

## 2023-08-31 HISTORY — PX: IMAGE GUIDED SINUS SURGERY: SHX6570

## 2023-08-31 HISTORY — DX: Claustrophobia: F40.240

## 2023-08-31 HISTORY — DX: Spinal stenosis, site unspecified: M48.00

## 2023-08-31 SURGERY — SINUS SURGERY, WITH IMAGING GUIDANCE
Anesthesia: General | Site: Nose | Laterality: Right

## 2023-08-31 MED ORDER — ONDANSETRON HCL 4 MG/2ML IJ SOLN
INTRAMUSCULAR | Status: DC | PRN
  Administered 2023-08-31: 4 mg via INTRAVENOUS

## 2023-08-31 MED ORDER — LIDOCAINE HCL (CARDIAC) PF 100 MG/5ML IV SOSY
PREFILLED_SYRINGE | INTRAVENOUS | Status: DC | PRN
  Administered 2023-08-31: 60 mg via INTRAVENOUS

## 2023-08-31 MED ORDER — PROPOFOL 1000 MG/100ML IV EMUL
INTRAVENOUS | Status: AC
  Filled 2023-08-31: qty 100

## 2023-08-31 MED ORDER — HYDROCODONE-ACETAMINOPHEN 5-325 MG PO TABS: 1.0000 | ORAL_TABLET | Freq: Four times a day (QID) | ORAL | 0 refills | Status: DC | PRN

## 2023-08-31 MED ORDER — FENTANYL CITRATE (PF) 100 MCG/2ML IJ SOLN
INTRAMUSCULAR | Status: AC
  Filled 2023-08-31: qty 2

## 2023-08-31 MED ORDER — DEXAMETHASONE SODIUM PHOSPHATE 4 MG/ML IJ SOLN
INTRAMUSCULAR | Status: DC | PRN
  Administered 2023-08-31: 4 mg via INTRAVENOUS

## 2023-08-31 MED ORDER — ACETAMINOPHEN 10 MG/ML IV SOLN
INTRAVENOUS | Status: AC
  Filled 2023-08-31: qty 100

## 2023-08-31 MED ORDER — ONDANSETRON HCL 4 MG PO TABS: 4.0000 mg | ORAL_TABLET | Freq: Three times a day (TID) | ORAL | 0 refills | Status: AC | PRN

## 2023-08-31 MED ORDER — DOXYCYCLINE HYCLATE 100 MG PO CAPS: 100.0000 mg | ORAL_CAPSULE | Freq: Two times a day (BID) | ORAL | 0 refills | Status: DC

## 2023-08-31 MED ORDER — ACETAMINOPHEN 10 MG/ML IV SOLN
1000.0000 mg | Freq: Once | INTRAVENOUS | Status: AC
Start: 2023-08-31 — End: 2023-08-31
  Administered 2023-08-31: 1000 mg via INTRAVENOUS

## 2023-08-31 MED ORDER — MIDAZOLAM HCL 2 MG/2ML IJ SOLN
2.0000 mg | Freq: Once | INTRAMUSCULAR | Status: AC
  Administered 2023-08-31: 2 mg via INTRAVENOUS

## 2023-08-31 MED ORDER — LIDOCAINE-EPINEPHRINE 1 %-1:100000 IJ SOLN
INTRAMUSCULAR | Status: DC | PRN
  Administered 2023-08-31: 6 mL

## 2023-08-31 MED ORDER — PHENYLEPHRINE HCL (PRESSORS) 10 MG/ML IV SOLN
INTRAVENOUS | Status: DC | PRN
Start: 2023-08-31 — End: 2023-08-31
  Administered 2023-08-31 (×2): 100 ug via INTRAVENOUS

## 2023-08-31 MED ORDER — FENTANYL CITRATE (PF) 100 MCG/2ML IJ SOLN
INTRAMUSCULAR | Status: DC | PRN
  Administered 2023-08-31: 100 ug via INTRAVENOUS

## 2023-08-31 MED ORDER — SODIUM CHLORIDE 0.9 % IV SOLN: INTRAVENOUS | Status: DC | PRN

## 2023-08-31 MED ORDER — SUCCINYLCHOLINE CHLORIDE 200 MG/10ML IV SOSY
PREFILLED_SYRINGE | INTRAVENOUS | Status: DC | PRN
  Administered 2023-08-31: 140 mg via INTRAVENOUS

## 2023-08-31 MED ORDER — OXYMETAZOLINE HCL 0.05 % NA SOLN
NASAL | Status: DC | PRN
  Administered 2023-08-31: 1

## 2023-08-31 MED ORDER — GLYCOPYRROLATE 0.2 MG/ML IJ SOLN
INTRAMUSCULAR | Status: DC | PRN
  Administered 2023-08-31: .2 mg via INTRAVENOUS

## 2023-08-31 MED ORDER — DEXMEDETOMIDINE HCL IN NACL 80 MCG/20ML IV SOLN
INTRAVENOUS | Status: DC | PRN
  Administered 2023-08-31: 12 ug via INTRAVENOUS

## 2023-08-31 MED ORDER — MIDAZOLAM HCL 2 MG/2ML IJ SOLN
INTRAMUSCULAR | Status: AC
  Filled 2023-08-31: qty 2

## 2023-08-31 MED ORDER — PROPOFOL 10 MG/ML IV BOLUS
INTRAVENOUS | Status: DC | PRN
  Administered 2023-08-31: 150 mg via INTRAVENOUS

## 2023-08-31 SURGICAL SUPPLY — 20 items
BLADE SHAVER TRUDI 4 15 DEG (ENT DISPOSABLE) IMPLANT
CABLE TRUDI DISPOSABLE (ENT DISPOSABLE) ×4 IMPLANT
CANISTER SUCT 1200ML W/VALVE (MISCELLANEOUS) ×2 IMPLANT
COAGULATOR SUCTION 6 10FR HC (MISCELLANEOUS) ×2 IMPLANT
DRESSING NASL FOAM PST OP SINU (MISCELLANEOUS) IMPLANT
ELECTRODE REM PT RTRN 9FT ADLT (ELECTROSURGICAL) ×2 IMPLANT
GLOVE SURG GAMMEX PI TX LF 7.5 (GLOVE) ×4 IMPLANT
GOWN STRL REUS W/ TWL LRG LVL3 (GOWN DISPOSABLE) ×2 IMPLANT
IV NS 250ML BAXH (IV SOLUTION) ×2 IMPLANT
KIT TURNOVER KIT A (KITS) ×2 IMPLANT
NS IRRIG 500ML POUR BTL (IV SOLUTION) ×2 IMPLANT
PACK ENT CUSTOM (PACKS) ×2 IMPLANT
PACKING NASAL EPIS 4X2.4 XEROG (MISCELLANEOUS) IMPLANT
PATTIES SURGICAL .5 X3 (DISPOSABLE) ×2 IMPLANT
SOLUTION ANTFG W/FOAM PAD STRL (MISCELLANEOUS) ×2 IMPLANT
STRAP BODY AND KNEE 60X3 (MISCELLANEOUS) ×2 IMPLANT
SYR 10ML LL (SYRINGE) ×2 IMPLANT
TRACKER DISPOSABLE PAITIENT (MISCELLANEOUS) ×2 IMPLANT
TUBING CONNECTING 10 (TUBING) ×2 IMPLANT
TUBING IRRIGATION BIEN-AIR (TUBING) ×2 IMPLANT

## 2023-08-31 NOTE — Anesthesia Procedure Notes (Addendum)
 Procedure Name: Intubation Date/Time: 08/31/2023 9:56 AM  Performed by: Adrien Horner, CRNAPre-anesthesia Checklist: Patient identified, Emergency Drugs available, Suction available, Patient being monitored and Timeout performed Patient Re-evaluated:Patient Re-evaluated prior to induction Oxygen Delivery Method: Circle system utilized Preoxygenation: Pre-oxygenation with 100% oxygen Induction Type: IV induction and Rapid sequence Laryngoscope Size: Mac and 3 Grade View: Grade II Tube type: Oral Rae Tube size: 7.0 mm Number of attempts: 1 Airway Equipment and Method: LTA kit utilized Placement Confirmation: ETT inserted through vocal cords under direct vision, positive ETCO2 and breath sounds checked- equal and bilateral Tube secured with: Tape Dental Injury: Teeth and Oropharynx as per pre-operative assessment

## 2023-08-31 NOTE — Anesthesia Postprocedure Evaluation (Signed)
 Anesthesia Post Note  Patient: Sandra Cantrell  Procedure(s) Performed: SINUS SURGERY, WITH IMAGING GUIDANCE (Bilateral: Nose) MAXILLARY ANTROSTOMY (Right: Nose)  Patient location during evaluation: PACU Anesthesia Type: General Level of consciousness: awake and alert Pain management: pain level controlled Vital Signs Assessment: post-procedure vital signs reviewed and stable Respiratory status: spontaneous breathing, nonlabored ventilation, respiratory function stable and patient connected to nasal cannula oxygen Cardiovascular status: blood pressure returned to baseline and stable Postop Assessment: no apparent nausea or vomiting Anesthetic complications: no   No notable events documented.   Last Vitals:  Vitals:   08/31/23 1039 08/31/23 1059  BP: 117/67 127/77  Pulse: 64 65  Resp: 18 16  Temp: 36.7 C 36.7 C  SpO2: 100% 100%    Last Pain:  Vitals:   08/31/23 1059  TempSrc:   PainSc: 2                  Gisselle Galvis C Rodneshia Greenhouse

## 2023-08-31 NOTE — Op Note (Signed)
..  08/31/2023  10:33 AM    Sandra Cantrell  [id number]   Pre-Op Dx:  Chronic Rhinosinusitis refractory to medical treatment  Post-op Dx: Same  Proc:   1)  Revision Right Maxillary Antrostomy  2)  Image Guided Sinus Surgery    Surg:  [name] [name]  Anes:  General  EBL:  <80ml  Comp:  None  Findings: Surgical ostia that was not connected to natural ostia.  Large amount of uncinate process remaining.  Large surgical antrostomy done connecting previous antrostomy with natural ostia.  Procedure: After the patient was identified in holding and the benefits of the procedure were reviewed as well as the consent and risks.  The patient was taken to the operating room and with the patient in a comfortable supine position,  general orotracheal anesthesia was induced without difficulty.  A proper time-out was performed.  The Acclarent image guidance system was set up and calibrated in the normal fashion..       The patient next received preoperative Afrin spray for topical decongestion and vasoconstriction and 1% Xylocaine  with 1:100,000 epinephrine, 6 cc's, was infiltrated into the right inferior turbinate, septum, and anterior middle turbinate.  Several minutes were allowed for this to take effect.  Cottoniod pledgets soaked in Afrin were placed into the right nasal cavity and left while the patient was prepped and draped in the standard fashion.  The materials were removed from the nose and observed to be intact and correct in number.  The nose was next inspected with a zero degree endoscope and the middle turbinates were medialized and afrin soaked pledgets were placed lateral to the turbinates for approximately one minute.  At this time attention was directed to the patient's right maxillary sinus.  On the right, a ball tipped probe was used to bring the uncinate process anteriorly.  The probe was placed through the natural ostia and this was used to create a larger opening connecting to  the previous surgical ostia.  Significant amount of scar tissue surrounding the surgical ostia with extension laterally and inferior to edema in the maxilary sinus.  Using a Diego microdebrider, the maxillary antrostomy was enlarged for a widely patent maxillary antrostomy.  Hemostasis was performed with topical Afrin soaked pledgets.  Synechiae within the maxillary sinus were divided with curved suction.   Visualization with a zero degree endoscope was used to examine the right maxillary antrostomies which was noted to be widely patent and in continuity with the natural os.  The residual uncinate process was removed with ethmoid forceps.  Hemostasis was continued.  Stamberger sinufoam was placed along the maxillary antrostomy.  Xerogel was fashioned and placed lateral to the middle turbinate.  Sinufoam was then placed anteriorly to these.  Care of the patient was transferred to anesthesia where they were transported to PACU in good condition.  Dispo:   PACU to home  Plan: Ice, elevation, narcotic analgesia and prophylactic antibiotics.  We will reevaluate the patient in the office in 7 days.  Return to work in 7-10 days, strenuous activities in two weeks.   [name] [name] 08/31/2023 10:33 AM

## 2023-08-31 NOTE — H&P (Signed)
 ..  History and Physical paper copy reviewed and updated date of procedure and will be scanned into system.  Patient seen and examined and marked.

## 2023-08-31 NOTE — Anesthesia Preprocedure Evaluation (Addendum)
 Anesthesia Evaluation  Patient identified by MRN, date of birth, ID band Patient awake    Reviewed: Allergy & Precautions, H&P , NPO status , Patient's Chart, lab work & pertinent test results  Airway Mallampati: I  TM Distance: >3 FB Neck ROM: Full    Dental no notable dental hx.    Pulmonary Current Smoker and Patient abstained from smoking.   Pulmonary exam normal breath sounds clear to auscultation       Cardiovascular negative cardio ROS Normal cardiovascular exam Rhythm:Regular Rate:Normal     Neuro/Psych  Headaches PSYCHIATRIC DISORDERS Anxiety Depression    negative neurological ROS  negative psych ROS   GI/Hepatic negative GI ROS, Neg liver ROS,GERD  ,,Severe GERD that awakens her at night and bothers her when she bends over   Endo/Other  negative endocrine ROS    Renal/GU negative Renal ROS  negative genitourinary   Musculoskeletal negative musculoskeletal ROS (+)    Abdominal   Peds negative pediatric ROS (+)  Hematology negative hematology ROS (+)   Anesthesia Other Findings Post traumatic stress disorder (PTSD) Anxiety Depression  Stress headaches Multiple allergies  GERD (gastroesophageal reflux disease) Pre-diabetes  Spinal stenosis Left arm weakness   Extremely anxious, will administer versed  2 mg IV preop, with pulse oximeter and pulse oximetry claustrophobia  Reproductive/Obstetrics negative OB ROS                             Anesthesia Physical Anesthesia Plan  ASA: 2  Anesthesia Plan: General ETT   Post-op Pain Management:    Induction: Intravenous, Rapid sequence and Cricoid pressure planned  PONV Risk Score and Plan:   Airway Management Planned: Oral ETT  Additional Equipment:   Intra-op Plan:   Post-operative Plan: Extubation in OR  Informed Consent: I have reviewed the patients History and Physical, chart, labs and discussed the procedure  including the risks, benefits and alternatives for the proposed anesthesia with the patient or authorized representative who has indicated his/her understanding and acceptance.     Dental Advisory Given  Plan Discussed with: Anesthesiologist, CRNA and Surgeon  Anesthesia Plan Comments: (Patient consented for risks of anesthesia including but not limited to:  - adverse reactions to medications - damage to eyes, teeth, lips or other oral mucosa - nerve damage due to positioning  - sore throat or hoarseness - Damage to heart, brain, nerves, lungs, other parts of body or loss of life  Patient voiced understanding and assent.)       Anesthesia Quick Evaluation

## 2023-08-31 NOTE — Transfer of Care (Signed)
 Immediate Anesthesia Transfer of Care Note  Patient: Sandra Cantrell  Procedure(s) Performed: SINUS SURGERY, WITH IMAGING GUIDANCE (Bilateral: Nose) MAXILLARY ANTROSTOMY (Right: Nose)  Patient Location: PACU  Anesthesia Type: General ETT  Level of Consciousness: awake, alert  and patient cooperative  Airway and Oxygen Therapy: Patient Spontanous Breathing and Patient connected to supplemental oxygen  Post-op Assessment: Post-op Vital signs reviewed, Patient's Cardiovascular Status Stable, Respiratory Function Stable, Patent Airway and No signs of Nausea or vomiting  Post-op Vital Signs: Reviewed and stable  Complications: No notable events documented.

## 2023-12-12 ENCOUNTER — Other Ambulatory Visit: Payer: Self-pay | Admitting: Family Medicine

## 2023-12-12 DIAGNOSIS — M5412 Radiculopathy, cervical region: Secondary | ICD-10-CM

## 2023-12-14 ENCOUNTER — Inpatient Hospital Stay
Admission: RE | Admit: 2023-12-14 | Discharge: 2023-12-14 | Disposition: A | Discharge status: Home / Self Care | Payer: Self-pay | Source: Ambulatory Visit | Attending: Neurosurgery | Admitting: Neurosurgery

## 2023-12-14 ENCOUNTER — Other Ambulatory Visit: Payer: Self-pay | Admitting: Family Medicine

## 2023-12-14 DIAGNOSIS — Z049 Encounter for examination and observation for unspecified reason: Secondary | ICD-10-CM

## 2023-12-16 ENCOUNTER — Inpatient Hospital Stay: Admission: RE | Admit: 2023-12-16 | Source: Ambulatory Visit

## 2023-12-16 NOTE — Progress Notes (Unsigned)
 Referring Physician:  Carlisle Benton CROME, FNP [id number] [place],  [place] [address]  Primary Physician:  Osa Geralds, NP  History of Present Illness: 12/21/2023 [name] has a history of GERD, depression, PTSD.   Seen by PMR at [facility] for neck and back pain.   She has constant LBP with intermittent left posterior leg pain to her calf x years. She has numbness in bilateral anterior thighs since her last pregnancy. She is in PT for her neck and not her back. Pain is worse with standing, sitting, and walking. Pain is better with laying down.   She has improvement with lumbar ESI but it was short term.   She is on neurontin and robaxin.   Tobacco use: smokes 3/4 PPD x 3 years.   Bowel/Bladder Dysfunction: none  Conservative measures:  Physical therapy: has not participated in PT for back pain, Doing PT at [facility] clinic for neck pain Multimodal medical therapy including regular antiinflammatories: Gabapentin, Tylenol , Robaxin, Hydrocodone  Injections:  10/28/2023: Bilateral L4-5 transforaminal ESI (50% relief, dexamethasone  12 mg)  09/30/2023: MBB to the bilateral L4-5 and L5-S1 facet joints (9/10 to 9/10) 08/11/2023: Bilateral L4-5 transforaminal ESI (50% relief)   Past Surgery: no spine surgery  The symptoms are causing a significant impact on the patient's life.   Review of Systems:  A 10 point review of systems is negative, except for the pertinent positives and negatives detailed in the HPI.  Past Medical History: Past Medical History:  Diagnosis Date   Anxiety    Claustrophobia    Depression    GERD (gastroesophageal reflux disease)    Left arm weakness    Multiple allergies    Post traumatic stress disorder (PTSD)    Pre-diabetes    Spinal stenosis    Stress headaches    also, rare migraines    Past Surgical History: Past Surgical History:  Procedure Laterality Date   DILATION AND CURETTAGE OF UTERUS     ESOPHAGOGASTRODUODENOSCOPY N/A  09/05/2015   Procedure: ESOPHAGOGASTRODUODENOSCOPY (EGD);  Surgeon: Lamar ONEIDA Holmes, MD;  Location: [facility];  Service: Endoscopy;  Laterality: N/A;   GANGLION CYST EXCISION Left 02/13/2015   Procedure: REMOVAL GANGLION OF WRIST;  Surgeon: Franky Curia, MD;  Location: [facility];  Service: Orthopedics;  Laterality: Left;   IMAGE GUIDED SINUS SURGERY Bilateral 08/31/2023   Procedure: SINUS SURGERY, WITH IMAGING GUIDANCE;  Surgeon: Milissa Hamming, MD;  Location: [facility];  Service: ENT;  Laterality: Bilateral;   MAXILLARY ANTROSTOMY Right 08/31/2023   Procedure: MAXILLARY ANTROSTOMY;  Surgeon: Milissa Hamming, MD;  Location: [facility];  Service: ENT;  Laterality: Right;   TONSILLECTOMY     TUBAL LIGATION     WRIST ARTHROSCOPY Left 02/13/2015   Procedure: LEFT ARTHROSCOPY WRIST VOLAR GANGLION EXCISION;  Surgeon: Franky Curia, MD;  Location: [facility];  Service: Orthopedics;  Laterality: Left;    Allergies: Allergies as of 12/22/2023 - Review Complete 08/31/2023  Allergen Reaction Noted   Penicillins Anaphylaxis 02/06/2015   Oxycodone  Hives, Itching, and Nausea And Vomiting 08/23/2023   Capsicum Rash 08/23/2023   Tape Rash 08/23/2023    Medications: Outpatient Encounter Medications as of 12/22/2023  Medication Sig   atorvastatin (LIPITOR) 20 MG tablet Take 20 mg by mouth daily.   azelastine (ASTELIN) 0.1 % nasal spray Place into both nostrils 2 (two) times daily as needed for rhinitis. Use in each nostril as directed   cetirizine (ZYRTEC) 10 MG tablet Take 10  mg by mouth daily.   doxycycline  (VIBRAMYCIN ) 100 MG capsule Take 1 capsule (100 mg total) by mouth 2 (two) times daily.   Fluticasone Propionate (XHANCE NA) Place into the nose daily.   gabapentin (NEURONTIN) 300 MG capsule Take 300 mg by mouth 2 (two) times daily.   HYDROcodone -acetaminophen  (NORCO/VICODIN) 5-325 MG tablet Take 1 tablet by mouth every 6 (six) hours as needed  for moderate pain (pain score 4-6).   lamoTRIgine (LAMICTAL) 25 MG tablet Take 50 mg by mouth at bedtime.   montelukast (SINGULAIR) 10 MG tablet Take 10 mg by mouth at bedtime.   Multiple Vitamins-Minerals (EMERGEN-C IMMUNE PLUS PO) Take by mouth daily as needed.   omeprazole (PRILOSEC) 20 MG capsule Take 20 mg by mouth daily.   ondansetron  (ZOFRAN ) 4 MG tablet Take 1 tablet (4 mg total) by mouth every 8 (eight) hours as needed for nausea or vomiting.   PARoxetine (PAXIL) 10 MG tablet Take 10 mg by mouth daily.   No facility-administered encounter medications on file as of 12/22/2023.    Social History: Social History   Tobacco Use   Smoking status: Every Day    Current packs/day: 0.75    Average packs/day: 0.7 packs/day for 3.6 years (2.7 ttl pk-yrs)    Types: Cigarettes    Start date: 2022   Smokeless tobacco: Never  Vaping Use   Vaping status: Never Used  Substance Use Topics   Alcohol use: No    Alcohol/week: 0.0 standard drinks of alcohol   Drug use: No    Family Medical History: Family History  Problem Relation Age of Onset   Diabetes Mellitus II Father    Diabetes Mellitus II Mother    Breast cancer Maternal Aunt    CAD Maternal Grandfather     Physical Examination: There were no vitals filed for this visit.  General: Patient is well developed, well nourished, calm, collected, and in no apparent distress. Attention to examination is appropriate.  Respiratory: Patient is breathing without any difficulty.   NEUROLOGICAL:     Awake, alert, oriented to person, place, and time.  Speech is clear and fluent. Fund of knowledge is appropriate.   Cranial Nerves: Pupils equal round and reactive to light.  Facial tone is symmetric.    She has mild lower lumbar tenderness.   No abnormal lesions on exposed skin.   Strength: Side Biceps Triceps Deltoid Interossei Grip Wrist Ext. Wrist Flex.  R 5 5 5 5 5 5 5   L 5 5 5 5 5 5 5    Side Iliopsoas Quads Hamstring PF DF  EHL  R 5 5 5 5 5 5   L 5 5 5 5 5 5    Reflexes are 2+ and symmetric at the biceps, brachioradialis, patella and achilles.   Hoffman's is absent.  Clonus is not present.   Bilateral upper and lower extremity sensation is intact to light touch.     No pain with IR/ER of both hips.   Gait is slow.    Medical Decision Making  Imaging: Lumbar MRI dated 07/03/23:  FINDINGS: Segmentation:  5 lumbar type vertebral bodies.   Alignment:  Normal   Vertebrae: No significant vertebral body finding. 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   Conus medullaris and cauda equina: Conus extends to the L1 level. Conus and cauda equina appear normal.   Paraspinal and other soft tissues: Negative   Disc levels:   No significant finding at L3-4 or above.   L4-5: Mild  bulging of the disc. Bilateral facet osteoarthritis worse on the left than the right. Moderate multifactorial stenosis at this level that could cause neural compression on either or both sides. The facet arthritis could be painful, particularly on the left where there is more edematous change than on right.   L5-S1: Normal appearance of the disc. Minimal facet osteoarthritis. No stenosis.   IMPRESSION: 1. L4-5: Mild bulging of the disc. Bilateral facet osteoarthritis worse on the left than the right. Moderate multifactorial stenosis at this level that could cause neural compression on either or both sides. The facet arthritis could be painful, particularly on the left where there is more edematous change than on right. 2. L5-S1: Minimal facet osteoarthritis. No stenosis.     Electronically Signed   By: Oneil Officer M.D.   On: 07/11/2023 15:07   Left hip xrays dated 11/29/23:  FINDINGS: No acute fracture or dislocation. Regional soft tissues unremarkable. No aggressive osseus lesion. Mild left hip joint space narrowing and small osteophyte formation, similar to prior. Exam End: 11/29/23 10:09 Last Resulted:  11/29/23 15:24  Received From: [facility]  Result Received: 12/14/23 11:57    I have personally reviewed the images and agree with the above interpretation.   Lumbar xrays dated 06/16/23:  Lumbar spondylosis.   No report for above xrays.   Assessment and Plan: [name] constant LBP with intermittent left posterior leg pain to her calf x years. Pain is worse with standing, sitting, and walking.   She has difficulty with ADLs as she cannot stand/walk for long periods.   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   Symptoms are consistent with spinal stenosis.   Treatment options discussed with patient and following plan made:   - PT for lumbar spine. She wants to to go to [facility]. Message to [facility] to see if she will put in orders. They should call her.  - She does not want to do any further injections.  - If no improvement with PT (will need to do 6 weeks or be discharged), may need to see one of the surgeons to consider surgery options.  - Smoking cessation discussed and encouraged.  - She will follow up with me in 6 weeks and prn.   I spent a total of 40 minutes in face-to-face and non-face-to-face activities related to this patient's care today including review of outside records, review of imaging, review of symptoms, physical exam, discussion of differential diagnosis, discussion of treatment options, and documentation.   Thank you for involving me in the care of this patient.   Glade Boys PA-C Dept. of Neurosurgery

## 2023-12-19 ENCOUNTER — Ambulatory Visit
Admission: RE | Admit: 2023-12-19 | Discharge: 2023-12-19 | Disposition: A | Discharge status: Home / Self Care | Source: Ambulatory Visit | Attending: Family Medicine | Admitting: Family Medicine

## 2023-12-19 DIAGNOSIS — M5412 Radiculopathy, cervical region: Secondary | ICD-10-CM

## 2023-12-22 ENCOUNTER — Ambulatory Visit: Admitting: Orthopedic Surgery

## 2023-12-22 ENCOUNTER — Encounter: Payer: Self-pay | Admitting: Orthopedic Surgery

## 2023-12-22 VITALS — BP 124/84 | Ht 65.51 in | Wt 211.8 lb

## 2023-12-22 DIAGNOSIS — M79605 Pain in left leg: Secondary | ICD-10-CM

## 2023-12-22 DIAGNOSIS — M47816 Spondylosis without myelopathy or radiculopathy, lumbar region: Secondary | ICD-10-CM | POA: Diagnosis not present

## 2023-12-22 DIAGNOSIS — M48061 Spinal stenosis, lumbar region without neurogenic claudication: Secondary | ICD-10-CM

## 2023-12-22 DIAGNOSIS — M5416 Radiculopathy, lumbar region: Secondary | ICD-10-CM

## 2023-12-22 DIAGNOSIS — M48062 Spinal stenosis, lumbar region with neurogenic claudication: Secondary | ICD-10-CM

## 2023-12-22 NOTE — Patient Instructions (Signed)
 It was so nice to see you today. Thank you so much for coming in.    You have wear and tear in your back at L4-L5 along with spinal stenosis. I think this is causing the pain in your back and leg especially when you stand or walk.   I will message Benton and have her order PT for your lower back at [facility]. They should call you to schedule.   If you don't improve after 6 weeks of PT or if they discharge you then we can get you into see one of my surgeons.   I will make you a follow up with me in 6 weeks for now, but we can change if needed.   Please do not hesitate to call if you have any questions or concerns. You can also message me in MyChart.   Glade Boys PA-C [phone]     The physicians and staff at [facility] Neurosurgery at [facility] are committed to providing excellent care. You may receive a survey asking for feedback about your experience at our office. We value you your feedback and appreciate you taking the time to to fill it out. The [facility] leadership team is also available to discuss your experience in person, feel free to contact us  [phone].

## 2024-01-25 NOTE — Progress Notes (Addendum)
 [facility]                    PT Daily Progress Note  Patient's Name:Sandra Cantrell                        MRN #[id number]      Date:01/25/2024 Visit Number: 3   Progress Related to Long and Short Term Goals:                                                                                            []  ROM: []  Strength Pain -     while- [] at rest    [] During activity:  []  Functional goal:  where 0 = completely disabled, 100 equals full function.      []  Other: []  Balance:  Patient / Family Education:    []  Progressed HEP to include:  []  Reviewed previous HEP:   ___________________________________________________________________________________   Skilled Service Provided:  To: []  right   []  left     []  bilateral    [x]   core/body stability and balance  []  neck   []   shoulder   []  upper arm/  []  elbow   []  lower arm  []  wrist   []  hand   []   fingers  [x]  back     []  hip    []  upper leg    []  knee  []  lower leg   []   ankle  foot             Ther Ex Therapeutic Procedure CPT 97110 : 35 minutes         Manual Tx Manual Therapy CPT 97140: 10 minutes      E-Stim         Infrared     US  x min             Ice / Heat                Vaso-pneumaticcompress             [x]  ROM [x] joint mob [] inflam Setting: [] Circ  [] pain [] swelling  [] Flex [] Flex [] pain [] Healing [] pain [] inflam [] edema  [] Bal [x]  ROM [] swelling [] pain [] swelling [] swelling Temp:         F  Strength       [x]   [] ASTYM [] circulation [] circulation [] inflam [] circulation [] soft tissue mob   Level      [] -1        [] -2      [] -3  Progressive     E  HEP    Exercise          [x]   [] tension/spasms/soft tissue Mob [] tissue mob       Posture        []   [] Correct malaign [] tissue mob        Settings:        Subjective:  Pt reports that she continues to have significant pain with any increase in activity.   Treatment notes:  Manual B Hip, HS stretching.  Therex per flow  chart.  Assessment of  Treatment:Pt able to tolerate seated theres, but tolerance to standing remains very limited.  Due to poor tolerance to activity and poor response from therapy to this point, she would likely benefit from further intervention at this time.  Plan to discharge from PT pending follow up with neurosurgery next week.   Patient's Response to Treatment:  []  pain  []  pain    []   flexibility      [x]  endurance      [x]   strength     []   AROM      [x]   PROM       []   Spasms    []  Posture / alignment    []   Joint Mobility  []  Other:   Functional Improvement Noted:  Increased ability to  []  walk   []  stand for longer periods   []  dress with less help    []  lift     []   reach     []   bend    []  Other:  Remaining Impairment Requiring Continued Treatment:   []   flexibility   [x]   ROM   [x]   pain [x]  strength   [x]   weakness    []   balance   []   swelling   []   inflammation   []  Spasm  []  posture/alignment    []   Other:  Plan: []   Continue Plan of Care        []   Add:         []   Discharge after next visit    [x]   Discharge to HEP   []  Other:  Back Treatment Log   Date 9/11 9/17 9/24         Bike/Stepper (seat_____)  S x 10' S x10'         Treadmill            Leg Press            Wall Slides            Multi-Hip Flex  March x20 March x20         Multi-Hip Ext            Multi-Hip Abd            Multi-Hip Add            Knee Extension  2# LAQ x30 4# LAQ x30         Leg Curls   BTB x30         Standing shoulder flex            Standing shoulder abd            SLR            Marching            Bridges x20           Abdmonial Isometrics w/ball Seated hip flex   Hip flexion x20         Partial sit-ups            Pelvic Tilts            Prone Prop/ Prone Push Up            SKC/Piriformis/HAMS/QUADS            Clam x20 BTB x20 BTB x20         Ball Sq  x30 x30         Calf Raise  x20 x20                                                                                  Ultrasound/ Infrared            HP/CP with/ without  IFC              PT Billing Documentation Date of Onset: 01/02/23 Visit Number: 3       Frequently Used Timed Codes Therapeutic Procedure CPT 97110 : 35 minutes Manual Therapy CPT 97140: 10 minutes                  Total Treatment Time: 45 minutes     Therapist's : Eva LELON Hatchet, PT
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
## 2024-01-31 ENCOUNTER — Encounter: Payer: Self-pay | Admitting: Orthopedic Surgery

## 2024-01-31 ENCOUNTER — Ambulatory Visit (INDEPENDENT_AMBULATORY_CARE_PROVIDER_SITE_OTHER): Admitting: Orthopedic Surgery

## 2024-01-31 VITALS — BP 110/82 | Wt 212.8 lb

## 2024-01-31 DIAGNOSIS — M47812 Spondylosis without myelopathy or radiculopathy, cervical region: Secondary | ICD-10-CM

## 2024-01-31 DIAGNOSIS — R200 Anesthesia of skin: Secondary | ICD-10-CM

## 2024-01-31 DIAGNOSIS — M4802 Spinal stenosis, cervical region: Secondary | ICD-10-CM | POA: Diagnosis not present

## 2024-01-31 DIAGNOSIS — M50322 Other cervical disc degeneration at C5-C6 level: Secondary | ICD-10-CM | POA: Diagnosis not present

## 2024-01-31 NOTE — Patient Instructions (Signed)
 It was so nice to see you today. Thank you so much for coming in.    You have some wear and tear in your neck that is worse at C5-C6. This would explain your neck pain. Numbness and tingling in your hands may or may not be from your neck.   I will send Whitney a message to see if she can see you back to discuss injections in your neck.   I would continue with PT for your neck.   If numbness and tingling in hands gets worse, I would recommend an EMG/nerve conduction test. Let me know if you want to get this scheduled.   I will see you back as scheduled. Please do not hesitate to call if you have any questions or concerns. You can also message me in MyChart.   Glade Boys PA-C [phone]     The physicians and staff at [facility] are committed to providing excellent care. You may receive a survey asking for feedback about your experience at our office. We value you your feedback and appreciate you taking the time to to fill it out. The [facility] leadership team is also available to discuss your experience in person, feel free to contact us  [phone].

## 2024-02-10 NOTE — Progress Notes (Signed)
 Referring Physician:  Osa Geralds, NP [address] [place],  [place] [address]  Primary Physician:  Osa Geralds, NP  History of Present Illness: [name] has a history of GERD, depression, PTSD.   Last seen by me on 01/31/24 for neck pain. I have previously seen her for back pain as well.   She has cervical spondylosis with DDD bilateral foraminal stenosis at C5-C6. She saw PMR and is getting scheduled for cervical ESI.   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   She is in PT for her neck and back. We discussed having her see a surgeon if no improvement with PT for her back. Also discussed smoking cessation.   She is here for follow up.   She continues with constant LBP with intermittent left posterior leg pain to her calf that is worse at night and with walking. Both legs also feel heavy after walking or standing. Pain is worse with standing, sitting, and walking. Some relief with laying down. She has weakness in both legs. She notes intermittent tingling in left leg.   She is in PT for her back at [facility]- it is very difficult for her to do the exercises due to pain.   She is working on cutting down on her smoking.   She saw PMR for her neck and is has cervical ESI scheduled on 02/21/24 with Dr. Avanell.   Tobacco use: smokes 3/4 PPD x 3 years.   Bowel/Bladder Dysfunction: none  Conservative measures:  Physical therapy: has done 3 visits of PT for her back at [facility]- initial visit on 01/12/24 and last visit was on 01/25/24.  Multimodal medical therapy including regular antiinflammatories: Gabapentin, Tylenol , Robaxin, Hydrocodone  Injections:  10/28/2023: Bilateral L4-5 transforaminal ESI (50% relief, dexamethasone  12 mg)  09/30/2023: MBB to the bilateral L4-5 and L5-S1 facet joints (9/10 to 9/10) 08/11/2023: Bilateral L4-5 transforaminal ESI (50% relief)   Past Surgery: no spine surgery  The  symptoms are causing a significant impact on the patient's life.   Review of Systems:  A 10 point review of systems is negative, except for the pertinent positives and negatives detailed in the HPI.  Past Medical History: Past Medical History:  Diagnosis Date   Anxiety    Claustrophobia    Depression    GERD (gastroesophageal reflux disease)    Left arm weakness    Multiple allergies    Post traumatic stress disorder (PTSD)    Pre-diabetes    Spinal stenosis    Stress headaches    also, rare migraines    Past Surgical History: Past Surgical History:  Procedure Laterality Date   DILATION AND CURETTAGE OF UTERUS     ESOPHAGOGASTRODUODENOSCOPY N/A 09/05/2015   Procedure: ESOPHAGOGASTRODUODENOSCOPY (EGD);  Surgeon: Lamar ONEIDA Holmes, MD;  Location: [facility];  Service: Endoscopy;  Laterality: N/A;   GANGLION CYST EXCISION Left 02/13/2015   Procedure: REMOVAL GANGLION OF WRIST;  Surgeon: Franky Curia, MD;  Location: [facility];  Service: Orthopedics;  Laterality: Left;   IMAGE GUIDED SINUS SURGERY Bilateral 08/31/2023   Procedure: SINUS SURGERY, WITH IMAGING GUIDANCE;  Surgeon: Milissa Hamming, MD;  Location: [facility];  Service: ENT;  Laterality: Bilateral;   MAXILLARY ANTROSTOMY Right 08/31/2023   Procedure: MAXILLARY ANTROSTOMY;  Surgeon: Milissa Hamming, MD;  Location: [facility];  Service: ENT;  Laterality: Right;   TONSILLECTOMY     TUBAL LIGATION     WRIST ARTHROSCOPY Left  02/13/2015   Procedure: LEFT ARTHROSCOPY WRIST VOLAR GANGLION EXCISION;  Surgeon: Franky Curia, MD;  Location: [facility];  Service: Orthopedics;  Laterality: Left;    Allergies: Allergies as of 02/13/2024 - Review Complete 02/13/2024  Allergen Reaction Noted   Penicillins Anaphylaxis 02/06/2015   Oxycodone  Hives, Itching, and Nausea And Vomiting 08/23/2023   Wellbutrin  [bupropion ] Other (See Comments) 12/22/2023   Capsicum Rash 08/23/2023   Tape  Rash 08/23/2023    Medications: Outpatient Encounter Medications as of 02/13/2024  Medication Sig   atorvastatin (LIPITOR) 20 MG tablet Take 20 mg by mouth daily.   azelastine (ASTELIN) 0.1 % nasal spray Place into both nostrils 2 (two) times daily as needed for rhinitis. Use in each nostril as directed   cetirizine (ZYRTEC) 10 MG tablet Take 10 mg by mouth daily.   diazepam (VALIUM) 5 MG tablet Take 5-10 mg by mouth daily as needed.   Fluticasone Propionate (XHANCE NA) Place into the nose daily.   LORazepam (ATIVAN) 1 MG tablet Take 1 mg by mouth as needed.   methocarbamol (ROBAXIN) 500 MG tablet Take 500 mg by mouth 3 (three) times daily.   montelukast (SINGULAIR) 10 MG tablet Take 10 mg by mouth at bedtime.   omeprazole (PRILOSEC) 20 MG capsule Take 20 mg by mouth daily.   ondansetron  (ZOFRAN ) 4 MG tablet Take 1 tablet (4 mg total) by mouth every 8 (eight) hours as needed for nausea or vomiting.   PARoxetine (PAXIL) 10 MG tablet Take 10 mg by mouth daily.   traMADol (ULTRAM) 50 MG tablet Take 50 mg by mouth 2 (two) times daily.   [DISCONTINUED] gabapentin (NEURONTIN) 300 MG capsule Take 300 mg by mouth 2 (two) times daily.   No facility-administered encounter medications on file as of 02/13/2024.    Social History: Social History   Tobacco Use   Smoking status: Every Day    Current packs/day: 0.75    Average packs/day: 0.8 packs/day for 3.8 years (2.8 ttl pk-yrs)    Types: Cigarettes    Start date: 2022   Smokeless tobacco: Never  Vaping Use   Vaping status: Never Used  Substance Use Topics   Alcohol use: No    Alcohol/week: 0.0 standard drinks of alcohol   Drug use: No    Family Medical History: Family History  Problem Relation Age of Onset   Diabetes Mellitus II Father    Diabetes Mellitus II Mother    Breast cancer Maternal Aunt    CAD Maternal Grandfather     Physical Examination: Vitals:   02/13/24 1130  BP: 136/86       Awake, alert, oriented to  person, place, and time.  Speech is clear and fluent. Fund of knowledge is appropriate.   Cranial Nerves: Pupils equal round and reactive to light.  Facial tone is symmetric.    No abnormal lesions on exposed skin.   Strength: Side Biceps Triceps Deltoid Interossei Grip Wrist Ext. Wrist Flex.  R 5 5 5 5 5 5 5   L 5 5 5 5 5 5 5    Side Iliopsoas Quads Hamstring PF DF EHL  R 5 5 5 5 5 5   L 5 5 5 5 5 5    Reflexes are 2+ and symmetric at the biceps, brachioradialis, patella and achilles.   Hoffman's is absent.  Clonus is not present.   Bilateral upper and lower extremity sensation is intact to light touch.     No pain with IR/ER of both hips.  She ambulates with a cane.    Medical Decision Making  Imaging: None   Assessment and Plan: Ms. Endicott continues with constant LBP with intermittent left posterior leg pain to her calf that is worse at night and with walking. Both legs also feel heavy after walking or standing. She has weakness in both legs. She notes intermittent tingling in left leg.   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   Also with known cervical spondylosis with DDD bilateral foraminal stenosis at C5-C6.  Treatment options discussed with patient and following plan made:   - She will check back with PT at [facility]- would need to do for 6 weeks or be discharged prior to considering surgery for lumbar spine.  - She will continue to work on smoking cessation.  - Follow up with PMR for cervical ESI as scheduled.  - Will call her on Friday to regroup regarding PT and then possibly schedule her with one of the surgeons- she has no preference.  I spent a total of 20 minutes in face-to-face and non-face-to-face activities related to this patient's care today including review of outside records, review of imaging, review of symptoms, physical exam, discussion of differential diagnosis, discussion of  treatment options, and documentation.   Glade Boys PA-C Dept. of Neurosurgery

## 2024-02-13 ENCOUNTER — Encounter: Payer: Self-pay | Admitting: Orthopedic Surgery

## 2024-02-13 ENCOUNTER — Ambulatory Visit (INDEPENDENT_AMBULATORY_CARE_PROVIDER_SITE_OTHER): Admitting: Orthopedic Surgery

## 2024-02-13 VITALS — BP 136/86 | Ht 65.51 in | Wt 212.0 lb

## 2024-02-13 DIAGNOSIS — M48061 Spinal stenosis, lumbar region without neurogenic claudication: Secondary | ICD-10-CM | POA: Diagnosis not present

## 2024-02-13 DIAGNOSIS — R200 Anesthesia of skin: Secondary | ICD-10-CM

## 2024-02-13 DIAGNOSIS — M47812 Spondylosis without myelopathy or radiculopathy, cervical region: Secondary | ICD-10-CM | POA: Diagnosis not present

## 2024-02-13 DIAGNOSIS — M4802 Spinal stenosis, cervical region: Secondary | ICD-10-CM

## 2024-02-13 DIAGNOSIS — M5416 Radiculopathy, lumbar region: Secondary | ICD-10-CM

## 2024-02-13 DIAGNOSIS — M50322 Other cervical disc degeneration at C5-C6 level: Secondary | ICD-10-CM | POA: Diagnosis not present

## 2024-02-13 DIAGNOSIS — M48062 Spinal stenosis, lumbar region with neurogenic claudication: Secondary | ICD-10-CM

## 2024-02-13 DIAGNOSIS — M47816 Spondylosis without myelopathy or radiculopathy, lumbar region: Secondary | ICD-10-CM | POA: Diagnosis not present

## 2024-02-16 ENCOUNTER — Telehealth: Payer: Self-pay | Admitting: Orthopedic Surgery

## 2024-02-16 NOTE — Telephone Encounter (Signed)
 Pt is wanting to move fwd with surgery, she states that PT will be discharging her & I am needing to know where you'd like us  to place her on the schedule. Or if not with you, with either doctor?

## 2024-02-16 NOTE — Telephone Encounter (Signed)
 We need discharge note from PT. I don't see this in chart- she is going to [facility]   Once we have discharge note, she can see either surgeon (discuss lumbar surgery per [facility])- put in 30 minute f/u slot.

## 2024-02-16 NOTE — Telephone Encounter (Signed)
 Called pt to schedule appt, left voicemail

## 2024-03-07 NOTE — Progress Notes (Unsigned)
 Referring Physician:  Osa Geralds, NP [address] [place],  [place] [address]  Primary Physician:  Osa Geralds, NP  History of Present Illness: 03/08/2024 [name] is here today with a chief complaint of worsening chronic back pain. She was referred by [facility] for evaluation of her back pain.  She experiences constant lower back pain that radiates across the hips and worsens when standing still. The pain is accompanied by tingling and numbness in her legs, with shooting pain when transitioning from sitting to standing. This significantly impacts her daily activities, including walking her dogs and working.  She describes the sensation as her spine 'crushing down' when standing, with heavy feet when attempting to move. No nerve study has been conducted for her back.  She has a history of neck issues, including a disc herniation causing headaches, though this is not the primary concern. Certain positions exacerbate the neck discomfort, and a neck injection has provided some relief. She can bend well and touch the floor, which sometimes alleviates the back pain.   Discussed the use of AI scribe software for clinical note transcription with the patient, who gave verbal consent to proceed.  The symptoms are causing a significant impact on the patient's life.   I have utilized the care everywhere function in epic to review the outside records available from external health systems.  Progress Note from Glade Boys, [place] on 02/13/24:  History of Present Illness: [name] has a history of GERD, depression, PTSD.    Last seen by me on 01/31/24 for neck pain. I have previously seen her for back pain as well.    She has cervical spondylosis with DDD bilateral foraminal stenosis at C5-C6. She saw PMR and is getting scheduled for cervical ESI.    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    She is  in PT for her neck and back. We discussed having her see a surgeon if no improvement with PT for her back. Also discussed smoking cessation.    She is here for follow up.    She continues with constant LBP with intermittent left posterior leg pain to her calf that is worse at night and with walking. Both legs also feel heavy after walking or standing. Pain is worse with standing, sitting, and walking. Some relief with laying down. She has weakness in both legs. She notes intermittent tingling in left leg.    She is in PT for her back at [facility]- it is very difficult for her to do the exercises due to pain.    She is working on cutting down on her smoking.    She saw PMR for her neck and is has cervical ESI scheduled on 02/21/24 with Dr. Avanell.    Tobacco use: smokes 3/4 PPD x 3 years.    Bowel/Bladder Dysfunction: none   Conservative measures:  Physical therapy: has done 3 visits of PT for her back at [facility]- initial visit on 01/12/24 and last visit was on 01/25/24.  Multimodal medical therapy including regular antiinflammatories: Gabapentin, Tylenol , Robaxin, Hydrocodone  Injections:   10/28/2023: Bilateral L4-5 transforaminal ESI (50% relief, dexamethasone  12 mg)  09/30/2023: MBB to the bilateral L4-5 and L5-S1 facet joints (9/10 to 9/10) 08/11/2023: Bilateral L4-5 transforaminal ESI (50% relief)    Past Surgery: no spine surgery   Review of Systems:  A 10 point review of systems is negative, except for the pertinent positives and negatives  detailed in the HPI.  Past Medical History: Past Medical History:  Diagnosis Date   Anxiety    Claustrophobia    Depression    GERD (gastroesophageal reflux disease)    Left arm weakness    Multiple allergies    Post traumatic stress disorder (PTSD)    Pre-diabetes    Spinal stenosis    Stress headaches    also, rare migraines    Past Surgical History: Past Surgical History:  Procedure Laterality Date   DILATION AND CURETTAGE OF UTERUS      ESOPHAGOGASTRODUODENOSCOPY N/A 09/05/2015   Procedure: ESOPHAGOGASTRODUODENOSCOPY (EGD);  Surgeon: Lamar ONEIDA Holmes, MD;  Location: [facility] ENDOSCOPY;  Service: Endoscopy;  Laterality: N/A;   GANGLION CYST EXCISION Left 02/13/2015   Procedure: REMOVAL GANGLION OF WRIST;  Surgeon: Franky Curia, MD;  Location: [facility];  Service: Orthopedics;  Laterality: Left;   IMAGE GUIDED SINUS SURGERY Bilateral 08/31/2023   Procedure: SINUS SURGERY, WITH IMAGING GUIDANCE;  Surgeon: Milissa Hamming, MD;  Location: [facility];  Service: ENT;  Laterality: Bilateral;   MAXILLARY ANTROSTOMY Right 08/31/2023   Procedure: MAXILLARY ANTROSTOMY;  Surgeon: Milissa Hamming, MD;  Location: [facility];  Service: ENT;  Laterality: Right;   TONSILLECTOMY     TUBAL LIGATION     WRIST ARTHROSCOPY Left 02/13/2015   Procedure: LEFT ARTHROSCOPY WRIST VOLAR GANGLION EXCISION;  Surgeon: Franky Curia, MD;  Location: [facility];  Service: Orthopedics;  Laterality: Left;    Allergies: Allergies as of 03/08/2024 - Review Complete 02/13/2024  Allergen Reaction Noted   Penicillins Anaphylaxis 02/06/2015   Oxycodone  Hives, Itching, and Nausea And Vomiting 08/23/2023   Wellbutrin  [bupropion ] Other (See Comments) 12/22/2023   Capsicum Rash 08/23/2023   Tape Rash 08/23/2023    Medications:  Current Outpatient Medications:    atorvastatin (LIPITOR) 20 MG tablet, Take 20 mg by mouth daily., Disp: , Rfl:    azelastine (ASTELIN) 0.1 % nasal spray, Place into both nostrils 2 (two) times daily as needed for rhinitis. Use in each nostril as directed, Disp: , Rfl:    cetirizine (ZYRTEC) 10 MG tablet, Take 10 mg by mouth daily., Disp: , Rfl:    diazepam (VALIUM) 5 MG tablet, Take 5-10 mg by mouth daily as needed., Disp: , Rfl:    methocarbamol (ROBAXIN) 500 MG tablet, Take 500 mg by mouth 3 (three) times daily., Disp: , Rfl:    montelukast (SINGULAIR) 10 MG tablet, Take 10 mg by mouth at  bedtime., Disp: , Rfl:    omeprazole (PRILOSEC) 20 MG capsule, Take 20 mg by mouth daily., Disp: , Rfl:    ondansetron  (ZOFRAN ) 4 MG tablet, Take 1 tablet (4 mg total) by mouth every 8 (eight) hours as needed for nausea or vomiting., Disp: 20 tablet, Rfl: 0   PARoxetine (PAXIL) 10 MG tablet, Take 10 mg by mouth daily., Disp: , Rfl:    traMADol (ULTRAM) 50 MG tablet, Take 50 mg by mouth 2 (two) times daily., Disp: , Rfl:   Social History: Social History   Tobacco Use   Smoking status: Every Day    Current packs/day: 0.75    Average packs/day: 0.8 packs/day for 3.8 years (2.9 ttl pk-yrs)    Types: Cigarettes    Start date: 2022   Smokeless tobacco: Never  Vaping Use   Vaping status: Never Used  Substance Use Topics   Alcohol use: No    Alcohol/week: 0.0 standard drinks of alcohol   Drug use: No  Family Medical History: Family History  Problem Relation Age of Onset   Diabetes Mellitus II Father    Diabetes Mellitus II Mother    Breast cancer Maternal Aunt    CAD Maternal Grandfather     Physical Examination: Vitals:   03/08/24 1101  BP: 118/76    General: Patient is in no apparent distress. Attention to examination is appropriate.  Neck:   Supple.  Full range of motion.  Respiratory: Patient is breathing without any difficulty.   NEUROLOGICAL:     Awake, alert, oriented to person, place, and time.  Speech is clear and fluent.   Cranial Nerves: Pupils equal round and reactive to light.  Facial tone is symmetric.  Facial sensation is symmetric. Shoulder shrug is symmetric. Tongue protrusion is midline.  There is no pronator drift.  Strength: Side Biceps Triceps Deltoid Interossei Grip Wrist Ext. Wrist Flex.  R 5 5 5 5 5 5 5   L 5 5 5 5 5 5 5    Side Iliopsoas Quads Hamstring PF DF EHL  R 5 5 5 5 5 5   L 5 5 5 5 5 5    Reflexes are 1+ and symmetric at the biceps, triceps, brachioradialis, patella and achilles.   Hoffman's is absent.   Bilateral upper and lower  extremity sensation is intact to light touch.    No evidence of dysmetria noted.  Gait is antalgic.     Medical Decision Making  Imaging: MR C spine 12/19/2023 IMPRESSION: 1. Degenerative disc disease at C5-6 with a moderate sized broad-based left-sided disc osteophyte complex. There is mass effect on the left side of the thecal sac and left foraminal stenosis likely affecting the left C6 nerve root. Shallow right-sided disc osteophyte complex with mild foraminal narrowing medially. Possible irritation of the right C6 nerve root. 2. Mild annular bulge at C6-7 but no spinal or foraminal stenosis.     Electronically Signed   By: MYRTIS Stammer M.D.   On: 12/19/2023 18:35   MR L spine 07/03/2023 IMPRESSION: 1. L4-5: Mild bulging of the disc. Bilateral facet osteoarthritis worse on the left than the right. Moderate multifactorial stenosis at this level that could cause neural compression on either or both sides. The facet arthritis could be painful, particularly on the left where there is more edematous change than on right. 2. L5-S1: Minimal facet osteoarthritis. No stenosis.     Electronically Signed   By: Oneil Officer M.D.   On: 07/11/2023 15:07    I have personally reviewed the images and agree with the above interpretation.  Assessment and Plan: Ms. Janicki is a pleasant 58 y.o. female with back pain without sciatica.  She may have a component of neuropathic type pain.  She has no evidence of instability in her lower back.  I do not feel that she is to be a candidate for back surgery.  I will send her for nerve conduction study.  She additionally has some pain concerning for left hip trochanteric bursitis.  I will reach out to her nonoperative provider to see if we can arrange an injection for her.  If her nerve conduction study shows evidence of neuropathy, she may be a candidate for a spinal cord stimulation.  As it currently stands, she is not a good candidate for spine  surgery.  I spent a total of 30 minutes in this patient's care today. This time was spent reviewing pertinent records including imaging studies, obtaining and confirming history, performing a directed evaluation, formulating  and discussing my recommendations, and documenting the visit within the medical record.      Thank you for involving me in the care of this patient.      Jules Baty K. Clois MD, [facility] Neurosurgery

## 2024-03-08 ENCOUNTER — Ambulatory Visit: Admitting: Neurosurgery

## 2024-03-08 VITALS — BP 118/76 | Ht 65.0 in | Wt 211.2 lb

## 2024-03-08 DIAGNOSIS — M25552 Pain in left hip: Secondary | ICD-10-CM

## 2024-03-08 DIAGNOSIS — G8929 Other chronic pain: Secondary | ICD-10-CM

## 2024-03-08 DIAGNOSIS — M545 Low back pain, unspecified: Secondary | ICD-10-CM | POA: Diagnosis not present

## 2024-03-09 ENCOUNTER — Other Ambulatory Visit: Payer: Self-pay

## 2024-03-09 ENCOUNTER — Encounter: Payer: Self-pay | Admitting: Neurology

## 2024-03-09 DIAGNOSIS — R202 Paresthesia of skin: Secondary | ICD-10-CM

## 2024-05-18 ENCOUNTER — Ambulatory Visit: Admitting: Neurology

## 2024-05-18 DIAGNOSIS — R202 Paresthesia of skin: Secondary | ICD-10-CM | POA: Diagnosis not present

## 2024-05-18 NOTE — Procedures (Signed)
 " [facility] Neurology  [address] [place], [address]  [place], [place] [address] Tel: [phone] Fax: [phone] Test Date:  05/18/2024  Patient: Sandra Cantrell DOB: 1965-06-17 Physician: Tonita Blanch, DO  Sex: Female Height: 5' 5 Ref Phys: Reeves Daisy, MD  ID#: [id number]   Technician:    History: This is a 59 year old female referred for evaluation of bilateral leg pain.  NCV & EMG Findings: Electrodiagnostic testing of the right lower extremity and additional studies of the left shows: Bilateral sural and superficial peroneal sensory responses are within normal limits. Bilateral peroneal and tibial motor responses are within normal limits. Bilateral tibial H reflex studies are within normal limits. There is no evidence of active or chronic motor axonal changes affecting any of the tested muscles.  Motor unit configuration and recruitment pattern is within normal limits.  Impression: This is a normal study of the lower extremities.  In particular, there is no evidence of a large fiber sensorimotor polyneuropathy or lumbosacral radiculopathy.    ___________________________ Tonita Blanch, DO    Nerve Conduction Studies   Stim Site NR Peak (ms) Norm Peak (ms) O-P Amp (V) Norm O-P Amp  Left Sup Peroneal Anti Sensory (Ant Lat Mall)  32 C  12 cm    2.8 <4.6 12.4 >4  Right Sup Peroneal Anti Sensory (Ant Lat Mall)  32 C  12 cm    3.1 <4.6 10.1 >4  Left Sural Anti Sensory (Lat Mall)  32 C  Calf    3.6 <4.6 18.4 >4  Right Sural Anti Sensory (Lat Mall)  32 C  Calf    3.4 <4.6 21.0 >4     Stim Site NR Onset (ms) Norm Onset (ms) O-P Amp (mV) Norm O-P Amp Site1 Site2 Delta-0 (ms) Dist (cm) Vel (m/s) Norm Vel (m/s)  Left Peroneal Motor (Ext Dig Brev)  32 C  Ankle    5.2 <6.0 5.5 >2.5 B Fib Ankle 7.9 37.0 47 >40  B Fib    13.1  4.7  Poplt B Fib 1.6 8.0 50 >40  Poplt    14.7  4.7         Right Peroneal Motor (Ext Dig Brev)  32 C  Ankle    4.1 <6.0 6.0 >2.5 B Fib  Ankle 9.1 38.0 42 >40  B Fib    13.2  5.4  Poplt B Fib 1.3 7.0 54 >40  Poplt    14.5  4.9         Left Tibial Motor (Abd Hall Brev)  32 C  Ankle    5.0 <6.0 14.3 >4 Knee Ankle 9.9 42.0 42 >40  Knee    14.9  11.1         Right Tibial Motor (Abd Hall Brev)  32 C  Ankle    5.0 <6.0 17.4 >4 Knee Ankle 9.8 41.0 42 >40  Knee    14.8  16.3          Electromyography   Side Muscle Ins.Act Fibs Fasc Recrt Amp Dur Poly Activation Comment  Right AntTibialis Nml Nml Nml Nml Nml Nml Nml Nml N/A  Right Gastroc Nml Nml Nml Nml Nml Nml Nml Nml N/A  Right Flex Dig Long Nml Nml Nml Nml Nml Nml Nml Nml N/A  Right RectFemoris Nml Nml Nml Nml Nml Nml Nml Nml N/A  Right BicepsFemS Nml Nml Nml Nml Nml Nml Nml Nml N/A  Right GluteusMed Nml Nml Nml Nml Nml Nml Nml Nml N/A  Left  AntTibialis Nml Nml Nml Nml Nml Nml Nml Nml N/A  Left Flex Dig Long Nml Nml Nml Nml Nml Nml Nml Nml N/A  Left Gastroc Nml Nml Nml Nml Nml Nml Nml Nml N/A  Left RectFemoris Nml Nml Nml Nml Nml Nml Nml Nml N/A  Left BicepsFemS Nml Nml Nml Nml Nml Nml Nml Nml N/A  Left GluteusMed Nml Nml Nml Nml Nml Nml Nml Nml N/A      Waveforms:                         "

## 2024-05-23 ENCOUNTER — Telehealth: Payer: Self-pay

## 2024-05-23 NOTE — Progress Notes (Signed)
 Spoke with the patient about EMG results. I also turned this message into a telephone encounter. Appt scheduled.

## 2024-05-23 NOTE — Telephone Encounter (Signed)
 I spoke with the patient and notified her of the EMG results. She had additional questions and requested a follow up appt. I scheduled her a telephone visit on 06/01/23 at 11:30am. She verbalized understanding.

## 2024-05-23 NOTE — Telephone Encounter (Signed)
-----   Message from Reeves Daisy, MD sent at 05/20/2024  9:27 AM EST ----- Please let her know that her nerve conduction study is normal.  It does not show any evidence of nerve dysfunction.  She is not a candidate for any of the procedures we discussed from a surgical perspective.  I recommend she continues to follow-up with her nonoperative providers as they may be able to help her with either medications or other nonoperative interventions.  If she is not seeing a chiropractor, she could also consider that.

## 2024-05-31 ENCOUNTER — Ambulatory Visit: Admitting: Neurosurgery

## 2024-05-31 DIAGNOSIS — G8929 Other chronic pain: Secondary | ICD-10-CM

## 2024-05-31 DIAGNOSIS — M545 Low back pain, unspecified: Secondary | ICD-10-CM

## 2024-05-31 NOTE — Progress Notes (Signed)
 Reviewed nerve conduction study findings with [name].  Her nerve conduction study is normal.  She has no evidence of instability on her x-rays.  She has some stenosis at L4-5, but it does not correlate with her current symptoms.  She continues to have pain in her lower back and lateral aspect of her hips when she stands.  I do not think that she is a candidate for surgical intervention.  I will send a message to Dr. Avanell and Dr. Kathlynn so that they can reevaluate her.  This visit was performed via telephone.  Patient location: home Provider location: office  I spent a total of 4 minutes non-face-to-face activities for this visit on the date of this encounter including review of current clinical condition and response to treatment.  The patient is aware of and accepts the limits of this telehealth visit.

## 2024-06-18 ENCOUNTER — Encounter: Admitting: Obstetrics & Gynecology
